# Patient Record
Sex: Female | Born: 1970
Health system: Southern US, Community
[De-identification: ages and names within clinical notes are randomized; demographics above are authoritative.]

## PROBLEM LIST (undated history)

## (undated) HISTORY — PX: ABDOMINAL HYSTERECTOMY: SHX81

---

## 2016-08-24 ENCOUNTER — Emergency Department (HOSPITAL_BASED_OUTPATIENT_CLINIC_OR_DEPARTMENT_OTHER): Payer: 59

## 2016-08-24 ENCOUNTER — Encounter (HOSPITAL_BASED_OUTPATIENT_CLINIC_OR_DEPARTMENT_OTHER): Payer: Self-pay | Admitting: *Deleted

## 2016-08-24 ENCOUNTER — Emergency Department (HOSPITAL_BASED_OUTPATIENT_CLINIC_OR_DEPARTMENT_OTHER)
Admission: EM | Admit: 2016-08-24 | Discharge: 2016-08-24 | Disposition: A | Discharge status: Home / Self Care | Payer: 59 | Attending: Emergency Medicine | Admitting: Emergency Medicine

## 2016-08-24 DIAGNOSIS — S300XXA Contusion of lower back and pelvis, initial encounter: Secondary | ICD-10-CM

## 2016-08-24 DIAGNOSIS — S5002XA Contusion of left elbow, initial encounter: Secondary | ICD-10-CM | POA: Diagnosis not present

## 2016-08-24 DIAGNOSIS — Y929 Unspecified place or not applicable: Secondary | ICD-10-CM | POA: Diagnosis not present

## 2016-08-24 DIAGNOSIS — W109XXA Fall (on) (from) unspecified stairs and steps, initial encounter: Secondary | ICD-10-CM | POA: Insufficient documentation

## 2016-08-24 DIAGNOSIS — Y999 Unspecified external cause status: Secondary | ICD-10-CM | POA: Insufficient documentation

## 2016-08-24 DIAGNOSIS — S59902A Unspecified injury of left elbow, initial encounter: Secondary | ICD-10-CM | POA: Diagnosis present

## 2016-08-24 DIAGNOSIS — Y9301 Activity, walking, marching and hiking: Secondary | ICD-10-CM | POA: Diagnosis not present

## 2016-08-24 DIAGNOSIS — W19XXXA Unspecified fall, initial encounter: Secondary | ICD-10-CM

## 2016-08-24 NOTE — ED Provider Notes (Signed)
[facility] Provider Note   CSN: [id number] Arrival date & time: 08/24/16  5621  By signing my name below, I, Donna Thompson, attest that this documentation has been prepared under the direction and in the presence of Tilden Fossa, MD. Electronically Signed: Thelma Thompson, Scribe. 08/24/16. 9:27 PM.  History   Chief Complaint Chief Complaint  Patient presents with  . Fall  The history is provided by the patient. No language interpreter was used.    HPI Comments: Donna Thompson is a 46 y.o. female who presents to the Emergency Department complaining of constant left elbow pain s/p a fall that occurred 6 hours ago. She also has associated knot on her buttocks. She states she was walking down the stairs when she accidentally slipped and fell on her bottom on each step. She has used ice with no relief. She denies head injury or syncope. Pt walked normally after the accident. Pt is left-hand dominant. Pt has no other medical problems and NKDA other than penicillins.   History reviewed. No pertinent past medical history.  There are no active problems to display for this patient.   Past Surgical History:  Procedure Laterality Date  . ABDOMINAL HYSTERECTOMY      OB History    No data available       Home Medications    Prior to Admission medications   Not on File    Family History No family history on file.  Social History Social History  Substance Use Topics  . Smoking status: Never Smoker  . Smokeless tobacco: Never Used  . Alcohol use No     Allergies   Penicillins   Review of Systems Review of Systems  Musculoskeletal: Positive for arthralgias.  Skin: Positive for wound.  Neurological: Negative for syncope and headaches.  All other systems reviewed and are negative.    Physical Exam Updated Vital Signs BP (!) 145/85 (BP Location: Right Arm)   Pulse 96   Temp 98.8 F (37.1 C) (Oral)   Resp 20   Ht 5\' 5"  (1.651 m)   Wt 67.1 kg (148 lb)    SpO2 100%   BMI 24.63 kg/m   Physical Exam  Constitutional: She is oriented to person, place, and time. She appears well-developed and well-nourished.  HENT:  Head: Normocephalic and atraumatic.  Cardiovascular: Normal rate and regular rhythm.   No murmur heard. Pulmonary/Chest: Effort normal and breath sounds normal. No respiratory distress.  Abdominal: Soft. There is no tenderness. There is no rebound and no guarding.  Musculoskeletal: She exhibits tenderness. She exhibits no edema.  Elbow mild tenderness over the olecranon  No swelling, normal ROM 2+ radial pulses bilaterally Moderate swelling to left buttock Moderated TTP over lower sacrum  Neurological: She is alert and oriented to person, place, and time.  5/5 strength in all 4 extremities   Skin: Skin is warm and dry.  Psychiatric: She has a normal mood and affect. Her behavior is normal.  Nursing note and vitals reviewed.    ED Treatments / Results  DIAGNOSTIC STUDIES: Oxygen Saturation is 100% on RA, normal by my interpretation.    COORDINATION OF CARE: 8:38 PM Discussed treatment plan with pt at bedside and pt agreed to plan. Labs (all labs ordered are listed, but only abnormal results are displayed) Labs Reviewed - No data to display  EKG  EKG Interpretation None       Radiology Dg Sacrum/coccyx  Result Date: 08/24/2016 CLINICAL DATA:  Fall down stairs.  Sacral  pain. EXAM: SACRUM AND COCCYX - 2+ VIEW COMPARISON:  None. FINDINGS: There is no evidence of fracture or other focal bone lesions. IMPRESSION: Negative. Electronically Signed   By: Charlett NoseKevin  Dover M.D.   On: 08/24/2016 20:56   Dg Elbow Complete Left  Result Date: 08/24/2016 CLINICAL DATA:  Pain after fall EXAM: LEFT ELBOW - COMPLETE 3+ VIEW COMPARISON:  None. FINDINGS: There is no evidence of fracture, dislocation, or joint effusion. There is no evidence of arthropathy or other focal bone abnormality. Soft tissues are unremarkable. IMPRESSION:  Negative. Electronically Signed   By: Gerome Samavid  Williams III M.D   On: 08/24/2016 20:18    Procedures Procedures (including critical care time)  Medications Ordered in ED Medications - No data to display   Initial Impression / Assessment and Plan / ED Course  I have reviewed the triage vital signs and the nursing notes.  Pertinent labs & imaging results that were available during my care of the patient were reviewed by me and considered in my medical decision making (see chart for details).     Patient here for evaluation of injuries following a fall. She has a hematoma to the left gluteal region with no evidence of major hemorrhage or compartment syndrome. She is neurovascularly intact on examination. No evidence of pelvic or hip fracture. Discussed the patient home care following a fall with contusions with range of motion exercises for the elbow as well as ice packs for the gluteal hematoma. Discussed Tylenol or ibuprofen for pain. Patient follow up and return precautions discussed.  Final Clinical Impressions(s) / ED Diagnoses   Final diagnoses:  Fall, initial encounter  Contusion of left elbow, initial encounter  Contusion of buttock, initial encounter    New Prescriptions New Prescriptions   No medications on file  I personally performed the services described in this documentation, which was scribed in my presence. The recorded information has been reviewed and is accurate.     Tilden Fossaees, Lenore Moyano, MD 08/24/16 2129

## 2016-08-24 NOTE — ED Triage Notes (Signed)
Pt says she slipped down approximately 4 carpeted steps a couple of hours ago. She c/o a knot on the left buttocks and pain in the left elbow. No meds prior to arrival.

## 2017-02-28 DIAGNOSIS — Z9181 History of falling: Secondary | ICD-10-CM | POA: Diagnosis not present

## 2017-02-28 DIAGNOSIS — B20 Human immunodeficiency virus [HIV] disease: Secondary | ICD-10-CM | POA: Diagnosis not present

## 2017-02-28 DIAGNOSIS — Z21 Asymptomatic human immunodeficiency virus [HIV] infection status: Secondary | ICD-10-CM | POA: Diagnosis not present

## 2017-02-28 DIAGNOSIS — Z79899 Other long term (current) drug therapy: Secondary | ICD-10-CM | POA: Diagnosis not present

## 2017-02-28 DIAGNOSIS — Z111 Encounter for screening for respiratory tuberculosis: Secondary | ICD-10-CM | POA: Diagnosis not present

## 2017-02-28 DIAGNOSIS — Z113 Encounter for screening for infections with a predominantly sexual mode of transmission: Secondary | ICD-10-CM | POA: Diagnosis not present

## 2017-03-06 DIAGNOSIS — Z1231 Encounter for screening mammogram for malignant neoplasm of breast: Secondary | ICD-10-CM | POA: Diagnosis not present

## 2017-08-07 MED FILL — ATRIPLA 600-200-300 MG TABS: 600-200-300 | 30 days supply | Qty: 30 | Fill #0

## 2017-08-07 MED FILL — SHIPPING COST: 1 days supply | Qty: 1 | Fill #0

## 2017-08-15 DIAGNOSIS — Z21 Asymptomatic human immunodeficiency virus [HIV] infection status: Secondary | ICD-10-CM | POA: Diagnosis not present

## 2017-09-04 ENCOUNTER — Encounter: Payer: Self-pay | Admitting: Pharmacist

## 2017-09-04 ENCOUNTER — Ambulatory Visit (INDEPENDENT_AMBULATORY_CARE_PROVIDER_SITE_OTHER): Payer: 59 | Admitting: Pharmacist

## 2017-09-04 DIAGNOSIS — Z79899 Other long term (current) drug therapy: Secondary | ICD-10-CM

## 2017-09-04 MED ORDER — EFAVIRENZ-EMTRICITAB-TENOFOVIR 600-200-300 MG PO TABS: 1.0000 | ORAL_TABLET | Freq: Every day | ORAL | 5 refills | Status: AC

## 2017-09-04 MED FILL — ATRIPLA 600-200-300 MG TABS: 600-200-300 | 30 days supply | Qty: 30 | Fill #0

## 2017-09-04 NOTE — Progress Notes (Signed)
   S: [facility] specialty medication therapy.    Patient is currently taking Atripla for HIV. Patient is managed by Dr. Jenelle MagesHairston for this. She has been on Atripla for about 5 years.   Adherence: denies any missed doses  Efficacy: HIV RNA is undetectable and she denies any adverse effects. Her physician discussed switching to a newer HIV medication but she declined because she felt like this was working well for her  Dosing:  HIV-1 infection: Oral: One tablet once daily. Dosage adjustment for concomitant rifampin (only if patient weighs ?50 kg): Administer additional efavirenz 200 mg once daily.  Dosing: Renal Impairment  CrCl ? 50 mL/minute: No dosage adjustment necessary. CrCl <50 mL/minute: Not recommended.  Dosing: Hepatic Impairment  Mild hepatic impairment (Child-Pugh class A): No dosage adjustment necessary; use with caution. Moderate or severe hepatic impairment (Child-Pugh class B, C): Not recommended.  Drug-drug interactions: none  Monitoring: HIV RNA: undetectable CD4 count: 380 S/sx of opportunistic infections: denies Neuropsychiatric s/sx: denies Skin reactions: denies Hepatotoxicity: denies Nephrotoxicity: denies S/sx of lactic acidosis: denies S/sx of fractures: denies Fat redistribution: denies  O:    Creatinine and LFTs WNL per Care Everywhere  No results found for: WBC, HGB, HCT, MCV, PLT    Chemistry   No results found for: NA, K, CL, CO2, BUN, CREATININE, GLU No results found for: CALCIUM, ALKPHOS, AST, ALT, BILITOT    No results found for: CD4TCELL, CD4TABS  No results found for: HIV1RNAQUANT  HIV RNA undetetable  A/P: 1. Medication review: patient is on Atripla for HIV. Reviewed the medication with the patient, including the following: Atripla is a combination medication used for the treatment of HIV. Patient educated on purpose, proper use and potential adverse effects of Atripla. Common  adverse effects including metabolic disturbances, neuropsychiatric symptoms, and skin reactions. Adherence is crucial when using this drug to avoid mutations and resistance. No recommendations for changes.    Alvino BloodStacey Karl Hammer, PharmD, BCPS, BCACP, CPP Clinical Pharmacist Practitioner  [phone]

## 2017-10-06 MED FILL — SHIPPING COST: 1 days supply | Qty: 1 | Fill #1

## 2017-10-06 MED FILL — ATRIPLA 600-200-300 MG TABS: 600-200-300 | 30 days supply | Qty: 30 | Fill #1 | Status: TO

## 2018-06-17 IMAGING — DX DG ELBOW COMPLETE 3+V*L*
4 series · 4 of 4 positions shown · non-contrast
Comparison: None.

CLINICAL DATA: Pain after fall

EXAM:
LEFT ELBOW - COMPLETE 3+ VIEW

[elbow ap]
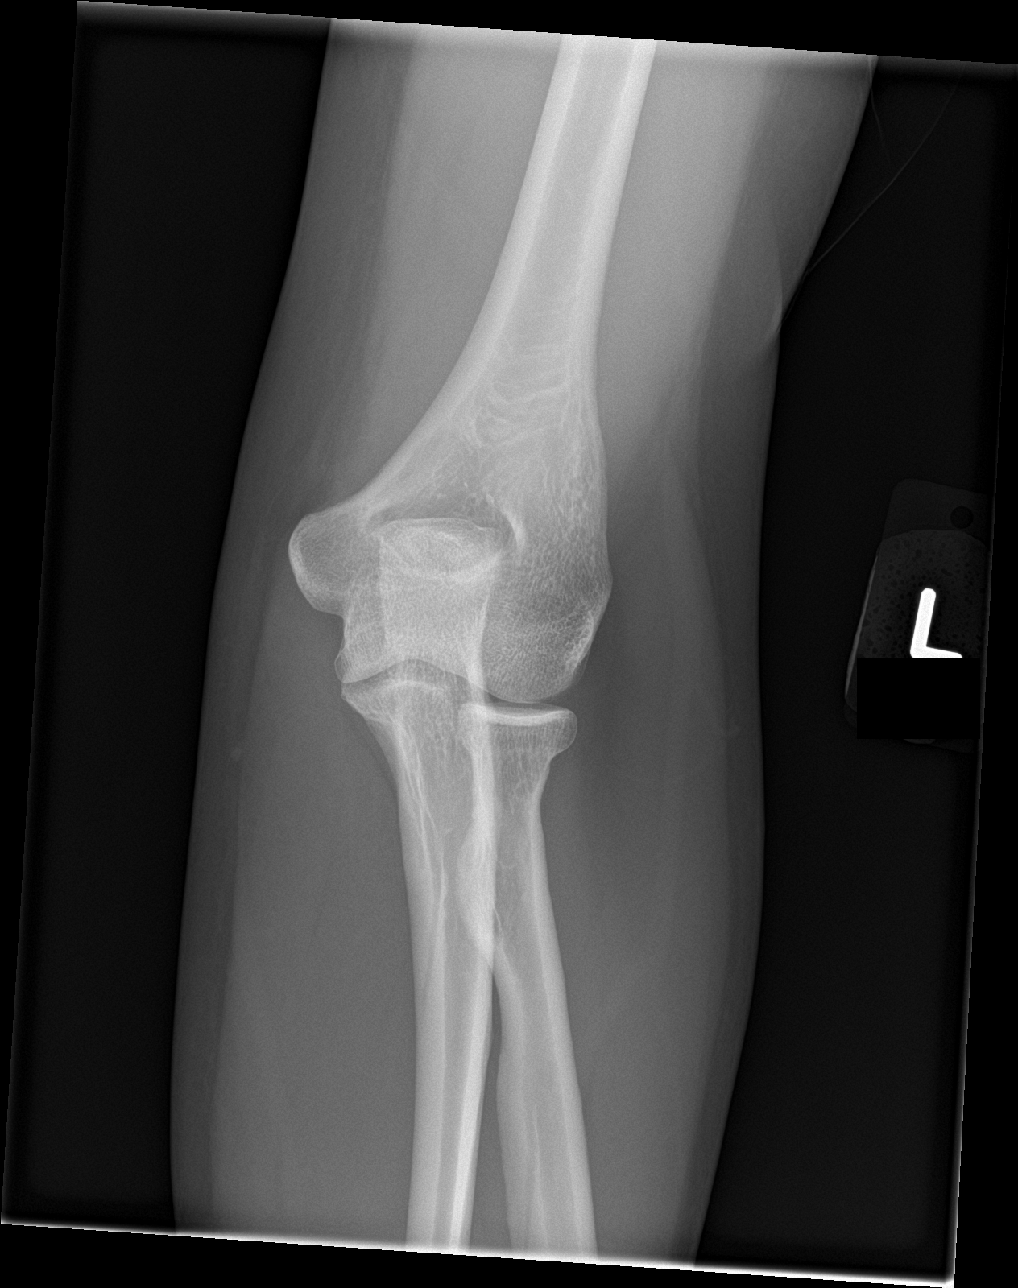

[elbow obl (1 of 2)]
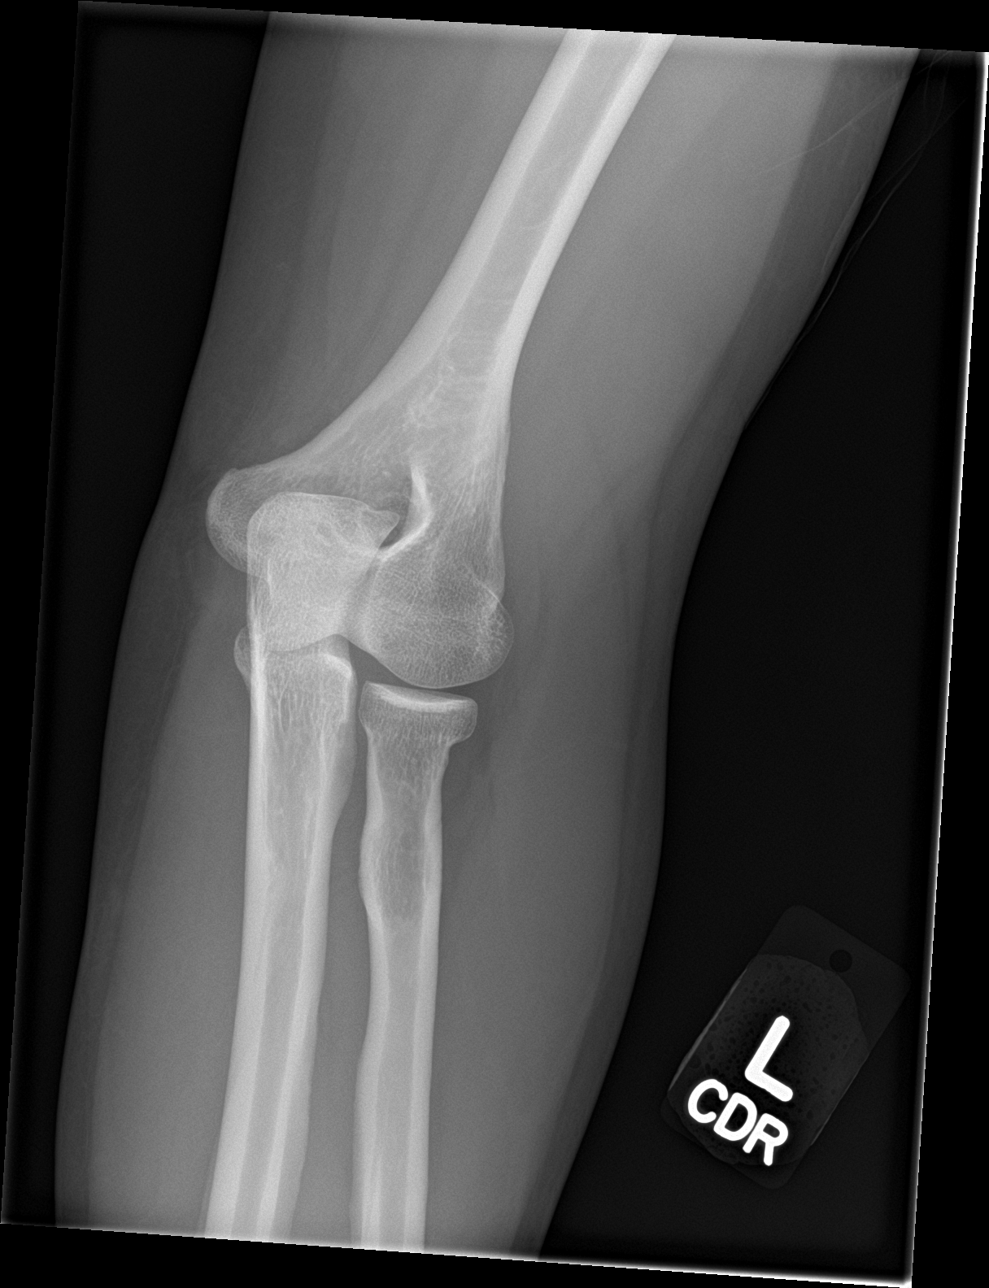

[elbow obl (2 of 2)]
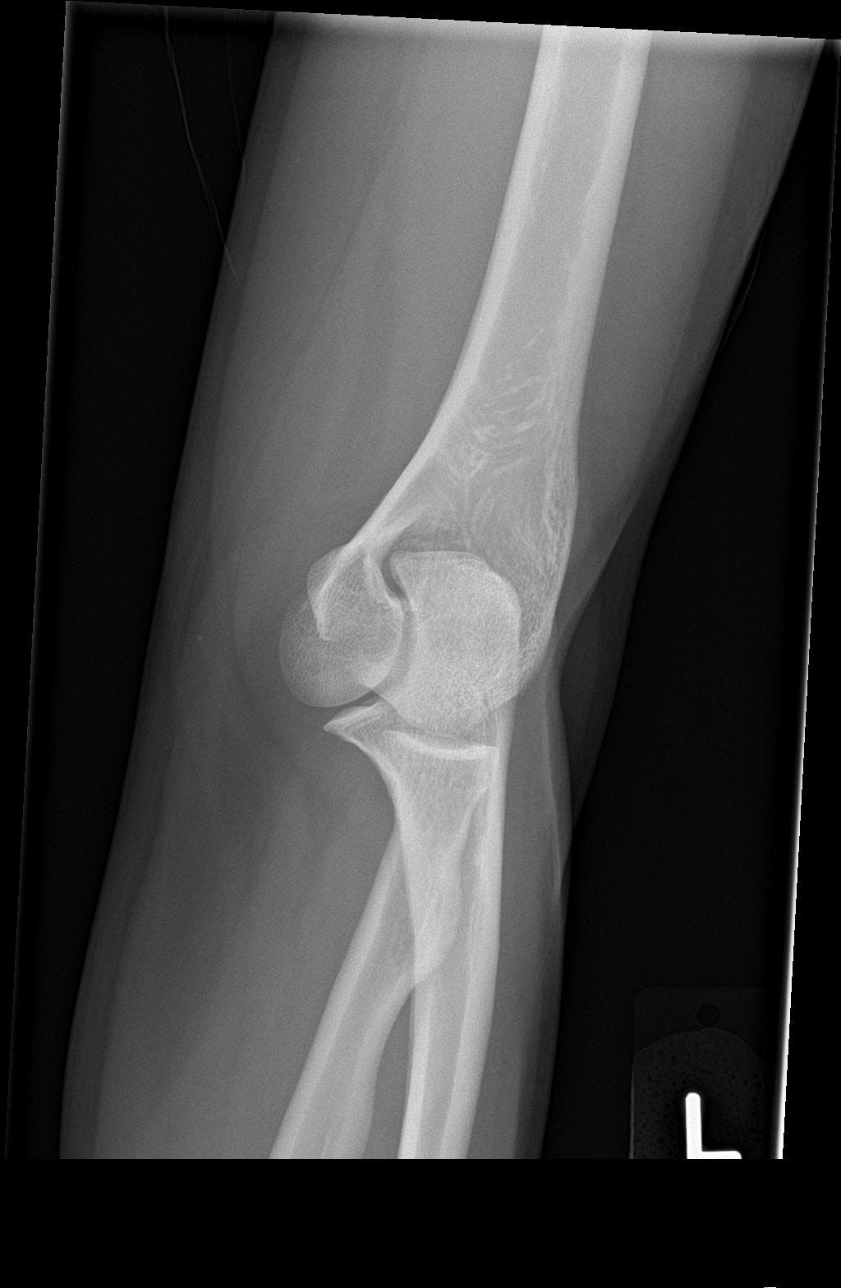

[elbow lat]
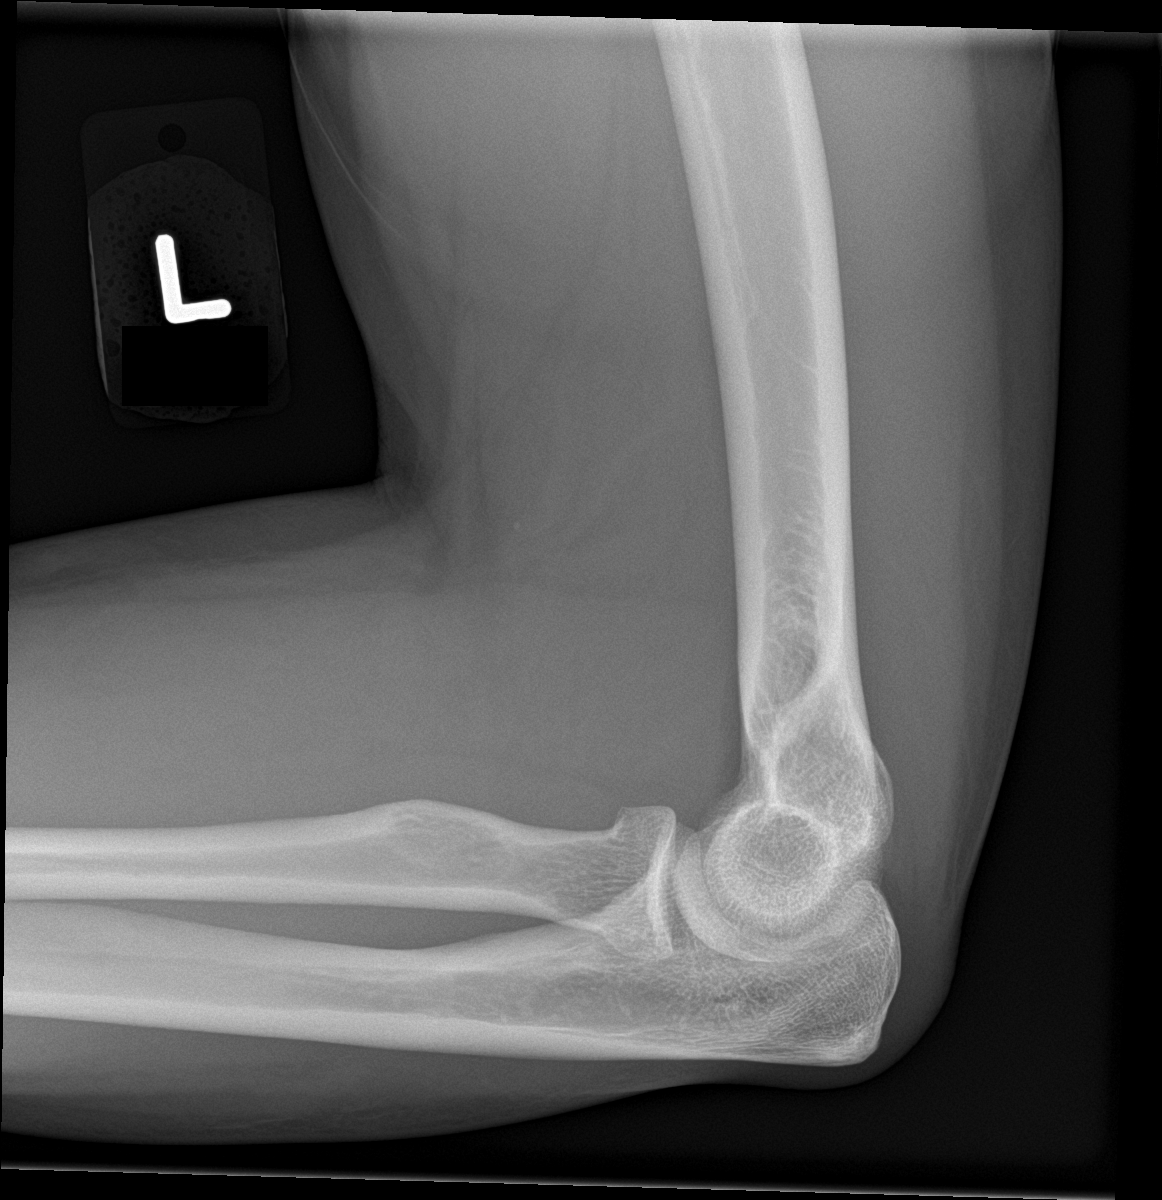

[4 of 4 positions shown; findings below may reference images not displayed]

FINDINGS: There is no evidence of fracture, dislocation, or joint effusion.
There is no evidence of arthropathy or other focal bone abnormality.
Soft tissues are unremarkable.
IMPRESSION: Negative.

## 2022-11-15 ENCOUNTER — Inpatient Hospital Stay (HOSPITAL_COMMUNITY): Payer: Managed Care, Other (non HMO)

## 2022-11-15 ENCOUNTER — Inpatient Hospital Stay (HOSPITAL_COMMUNITY)
Admission: EM | Admit: 2022-11-15 | Discharge: 2022-11-18 | DRG: 872 | Disposition: A | Discharge status: Home / Self Care | Payer: Managed Care, Other (non HMO) | Attending: Internal Medicine | Admitting: Internal Medicine

## 2022-11-15 ENCOUNTER — Emergency Department (HOSPITAL_COMMUNITY): Payer: Managed Care, Other (non HMO)

## 2022-11-15 ENCOUNTER — Encounter (HOSPITAL_COMMUNITY): Payer: Self-pay

## 2022-11-15 ENCOUNTER — Other Ambulatory Visit: Payer: Self-pay

## 2022-11-15 DIAGNOSIS — Z21 Asymptomatic human immunodeficiency virus [HIV] infection status: Secondary | ICD-10-CM | POA: Diagnosis present

## 2022-11-15 DIAGNOSIS — L02413 Cutaneous abscess of right upper limb: Secondary | ICD-10-CM | POA: Diagnosis present

## 2022-11-15 DIAGNOSIS — E538 Deficiency of other specified B group vitamins: Secondary | ICD-10-CM | POA: Diagnosis present

## 2022-11-15 DIAGNOSIS — D509 Iron deficiency anemia, unspecified: Secondary | ICD-10-CM | POA: Diagnosis present

## 2022-11-15 DIAGNOSIS — Z79899 Other long term (current) drug therapy: Secondary | ICD-10-CM

## 2022-11-15 DIAGNOSIS — M7989 Other specified soft tissue disorders: Secondary | ICD-10-CM

## 2022-11-15 DIAGNOSIS — R030 Elevated blood-pressure reading, without diagnosis of hypertension: Secondary | ICD-10-CM | POA: Diagnosis present

## 2022-11-15 DIAGNOSIS — M79604 Pain in right leg: Secondary | ICD-10-CM | POA: Diagnosis present

## 2022-11-15 DIAGNOSIS — M79674 Pain in right toe(s): Secondary | ICD-10-CM | POA: Diagnosis not present

## 2022-11-15 DIAGNOSIS — E876 Hypokalemia: Secondary | ICD-10-CM | POA: Diagnosis present

## 2022-11-15 DIAGNOSIS — B20 Human immunodeficiency virus [HIV] disease: Secondary | ICD-10-CM | POA: Diagnosis not present

## 2022-11-15 DIAGNOSIS — W57XXXA Bitten or stung by nonvenomous insect and other nonvenomous arthropods, initial encounter: Secondary | ICD-10-CM | POA: Diagnosis present

## 2022-11-15 DIAGNOSIS — Z90710 Acquired absence of both cervix and uterus: Secondary | ICD-10-CM

## 2022-11-15 DIAGNOSIS — L0390 Cellulitis, unspecified: Secondary | ICD-10-CM | POA: Diagnosis present

## 2022-11-15 DIAGNOSIS — Z880 Allergy status to penicillin: Secondary | ICD-10-CM

## 2022-11-15 DIAGNOSIS — A419 Sepsis, unspecified organism: Principal | ICD-10-CM

## 2022-11-15 DIAGNOSIS — L03115 Cellulitis of right lower limb: Secondary | ICD-10-CM | POA: Diagnosis present

## 2022-11-15 LAB — BASIC METABOLIC PANEL
Anion gap: 8 (ref 5–15)
BUN: 9 mg/dL (ref 6–20)
CO2: 23 mmol/L (ref 22–32)
Calcium: 8.5 mg/dL — ABNORMAL LOW (ref 8.9–10.3)
Chloride: 107 mmol/L (ref 98–111)
Creatinine, Ser: 0.6 mg/dL (ref 0.44–1.00)
GFR, Estimated: >60 mL/min (ref >60)
Glucose, Bld: 110 mg/dL — ABNORMAL HIGH (ref 70–99)
Potassium: 3.2 mmol/L — ABNORMAL LOW (ref 3.5–5.1)
Sodium: 138 mmol/L (ref 135–145)

## 2022-11-15 LAB — CBC WITH DIFFERENTIAL/PLATELET
Abs Immature Granulocytes: 0.09 10*3/uL — ABNORMAL HIGH (ref 0.00–0.07)
Basophils Absolute: 0 10*3/uL (ref 0.0–0.1)
Basophils Relative: 0 %
Eosinophils Absolute: 0.1 10*3/uL (ref 0.0–0.5)
Eosinophils Relative: 1 %
HCT: 26.4 % — ABNORMAL LOW (ref 36.0–46.0)
Hemoglobin: 8.4 g/dL — ABNORMAL LOW (ref 12.0–15.0)
Immature Granulocytes: 1 %
Lymphocytes Relative: 12 %
Lymphs Abs: 1.4 10*3/uL (ref 0.7–4.0)
MCH: 25.9 pg — ABNORMAL LOW (ref 26.0–34.0)
MCHC: 31.8 g/dL (ref 30.0–36.0)
MCV: 81.5 fL (ref 80.0–100.0)
Monocytes Absolute: 0.6 10*3/uL (ref 0.1–1.0)
Monocytes Relative: 5 %
Neutro Abs: 9.7 10*3/uL — ABNORMAL HIGH (ref 1.7–7.7)
Neutrophils Relative %: 81 %
Platelets: 268 10*3/uL (ref 150–400)
RBC: 3.24 MIL/uL — ABNORMAL LOW (ref 3.87–5.11)
RDW: 18.2 % — ABNORMAL HIGH (ref 11.5–15.5)
WBC: 11.9 10*3/uL — ABNORMAL HIGH (ref 4.0–10.5)
nRBC: 0 % (ref 0.0–0.2)

## 2022-11-15 MED ORDER — MORPHINE SULFATE (PF) 2 MG/ML IV SOLN: 1.0000 mg | INTRAVENOUS | Status: DC | PRN

## 2022-11-15 MED ORDER — NAPROXEN 250 MG PO TABS: 500.0000 mg | ORAL_TABLET | ORAL | Status: DC | PRN

## 2022-11-15 MED ORDER — METRONIDAZOLE 500 MG/100ML IV SOLN
500.0000 mg | Freq: Two times a day (BID) | INTRAVENOUS | Status: DC
  Administered 2022-11-15 – 2022-11-18 (×5): 500 mg via INTRAVENOUS
  Filled 2022-11-15 (×6): qty 100

## 2022-11-15 MED ORDER — ONDANSETRON HCL 4 MG/2ML IJ SOLN
4.0000 mg | Freq: Once | INTRAMUSCULAR | Status: AC
  Administered 2022-11-15: 4 mg via INTRAVENOUS
  Filled 2022-11-15: qty 2

## 2022-11-15 MED ORDER — HYDROMORPHONE HCL 1 MG/ML IJ SOLN
0.5000 mg | INTRAMUSCULAR | Status: DC | PRN
  Administered 2022-11-17: 0.5 mg via INTRAVENOUS
  Filled 2022-11-15: qty 0.5

## 2022-11-15 MED ORDER — TRAMADOL HCL 50 MG PO TABS
50.0000 mg | ORAL_TABLET | Freq: Four times a day (QID) | ORAL | Status: DC | PRN
  Administered 2022-11-17: 50 mg via ORAL
  Filled 2022-11-15: qty 1

## 2022-11-15 MED ORDER — VANCOMYCIN HCL 750 MG/150ML IV SOLN
750.0000 mg | Freq: Two times a day (BID) | INTRAVENOUS | Status: DC
  Administered 2022-11-16 – 2022-11-17 (×4): 750 mg via INTRAVENOUS
  Filled 2022-11-15 (×5): qty 150

## 2022-11-15 MED ORDER — MORPHINE SULFATE (PF) 4 MG/ML IV SOLN
4.0000 mg | Freq: Once | INTRAVENOUS | Status: AC
  Administered 2022-11-15: 4 mg via INTRAVENOUS
  Filled 2022-11-15: qty 1

## 2022-11-15 MED ORDER — MONTELUKAST SODIUM 10 MG PO TABS
10.0000 mg | ORAL_TABLET | Freq: Every day | ORAL | Status: DC
  Administered 2022-11-16 – 2022-11-18 (×3): 10 mg via ORAL
  Filled 2022-11-15 (×3): qty 1

## 2022-11-15 MED ORDER — ACETAMINOPHEN 325 MG PO TABS: 650.0000 mg | ORAL_TABLET | Freq: Four times a day (QID) | ORAL | Status: DC | PRN

## 2022-11-15 MED ORDER — SODIUM CHLORIDE 0.9 % IV BOLUS
1000.0000 mL | Freq: Once | INTRAVENOUS | Status: AC
  Administered 2022-11-15: 1000 mL via INTRAVENOUS

## 2022-11-15 MED ORDER — FERROUS SULFATE 325 (65 FE) MG PO TABS
325.0000 mg | ORAL_TABLET | Freq: Every day | ORAL | Status: DC
  Administered 2022-11-16 – 2022-11-18 (×3): 325 mg via ORAL
  Filled 2022-11-15 (×3): qty 1

## 2022-11-15 MED ORDER — EFAVIRENZ-EMTRICITAB-TENOFOVIR 600-200-300 MG PO TABS
1.0000 | ORAL_TABLET | Freq: Every day | ORAL | Status: DC
  Administered 2022-11-16 – 2022-11-17 (×2): 1 via ORAL
  Filled 2022-11-15 (×7): qty 1

## 2022-11-15 MED ORDER — ONDANSETRON HCL 4 MG/2ML IJ SOLN: 4.0000 mg | Freq: Four times a day (QID) | INTRAMUSCULAR | Status: DC | PRN

## 2022-11-15 MED ORDER — VANCOMYCIN HCL IN DEXTROSE 1-5 GM/200ML-% IV SOLN
1000.0000 mg | Freq: Once | INTRAVENOUS | Status: DC
  Filled 2022-11-15: qty 200

## 2022-11-15 MED ORDER — POTASSIUM CHLORIDE CRYS ER 20 MEQ PO TBCR
40.0000 meq | EXTENDED_RELEASE_TABLET | Freq: Once | ORAL | Status: AC
  Administered 2022-11-15: 40 meq via ORAL
  Filled 2022-11-15: qty 2

## 2022-11-15 MED ORDER — NAPROXEN 250 MG PO TABS
500.0000 mg | ORAL_TABLET | Freq: Two times a day (BID) | ORAL | Status: DC | PRN
  Administered 2022-11-15 – 2022-11-16 (×2): 500 mg via ORAL
  Filled 2022-11-15 (×2): qty 2

## 2022-11-15 MED ORDER — KETOROLAC TROMETHAMINE 15 MG/ML IJ SOLN
15.0000 mg | Freq: Once | INTRAMUSCULAR | Status: AC
  Administered 2022-11-15: 15 mg via INTRAVENOUS
  Filled 2022-11-15: qty 1

## 2022-11-15 MED ORDER — SODIUM CHLORIDE 0.9 % IV SOLN
2.0000 g | Freq: Three times a day (TID) | INTRAVENOUS | Status: DC
  Administered 2022-11-16 – 2022-11-18 (×6): 2 g via INTRAVENOUS
  Filled 2022-11-15 (×7): qty 12.5

## 2022-11-15 MED ORDER — SODIUM CHLORIDE 0.9 % IV SOLN
2.0000 g | Freq: Once | INTRAVENOUS | Status: AC
  Administered 2022-11-16: 2 g via INTRAVENOUS
  Filled 2022-11-15: qty 12.5

## 2022-11-15 NOTE — Assessment & Plan Note (Signed)
-  right foot cellulitis  -On exam, patient had notable blackened discoloration on the dorsal surface of right toes with a potential healed ulcer to the medial side of the right great toe.  However does not appear necrotic and no crepitus palpated.  Pain was not out of proportion with exam.  I performed a bedside Doppler and she had good dorsalis pedis and posterior tibialis pulse. Will still pursue ABI study to further evaluate -will broaden antibiotics with IV vancomycin, cefepime and Flagyl -blood cultures pending

## 2022-11-15 NOTE — ED Triage Notes (Signed)
Patient reports she got bit on her arm but now has discoloration and swelling with red streaking to right leg and foot.

## 2022-11-15 NOTE — Assessment & Plan Note (Signed)
-  last Hgb in 1/24 of 9. Now 8.4 on presentation -continue iron supplementation

## 2022-11-15 NOTE — ED Notes (Signed)
+  pedal pulse upon palpation but discoloration and possible pustule at base of great toe.

## 2022-11-15 NOTE — Plan of Care (Signed)
Problem: Clinical Measurements: Goal: Respiratory complications will improve Outcome: Progressing Goal: Cardiovascular complication will be avoided Outcome: Progressing   Problem: Activity: Goal: Risk for activity intolerance will decrease Outcome: Progressing   Problem: Elimination: Goal: Will not experience complications related to bowel motility Outcome: Progressing Goal: Will not experience complications related to urinary retention Outcome: Progressing   Problem: Pain Managment: Goal: General experience of comfort will improve Outcome: Progressing   Problem: Safety: Goal: Ability to remain free from injury will improve Outcome: Progressing

## 2022-11-15 NOTE — H&P (Signed)
History and Physical    Patient: Donna Thompson XBJ:[id number] DOB: 1971-01-13 DOA: 11/15/2022 DOS: the patient was seen and examined on 11/15/2022 PCP: System, Provider Not In  Patient coming from: Home  Chief Complaint:  Chief Complaint  Patient presents with   Cellulitis   HPI: [name] is a 52 y.o. female with medical history significant of HIV on antiviral, iron deficiency anemia who presents with discoloration of her right foot.   About 5 days ago noticed an insect bite to right anterior forearm that swelled up and then by the afternoon noticed swelling and discoloration of right great toe. Discoloration has since spread across her toes and foot. Entire foot is edematous. Has felt increase pain. No paresthesia. No fever or chills.   In the ED, she was afebrile with HR of 113, mildly elevated blood pressure of 141/81 on room air.  CBC with leukocytosis of 11.9, hemoglobin 8.4.  BMP with mild hypokalemia of 3.2 and otherwise normal electrolytes.  Venous Doppler ultrasound of bilateral lower extremity was negative for DVT.  Hospitalist then consulted for admission  On exam, patient had notable blackened discoloration on the dorsal surface of right toes with a potential healed ulcer to the medial side of the right great toe.  However does not appear necrotic.  No crepitus palpated.  Pain was not out of proportion with exam.  I performed a bedside Doppler and she had good dorsalis pedis and posterior tibialis pulse.  Review of Systems: As mentioned in the history of present illness. All other systems reviewed and are negative. History reviewed. No pertinent past medical history. Past Surgical History:  Procedure Laterality Date   ABDOMINAL HYSTERECTOMY     Social History:  reports that she has never smoked. She has never used smokeless tobacco. She reports that she does not drink alcohol and does not use drugs.  Allergies  Allergen Reactions   Penicillins Itching     History reviewed. No pertinent family history.  Prior to Admission medications   Medication Sig Start Date End Date Taking? Authorizing Provider  efavirenz-emtricitabine-tenofovir (ATRIPLA) 600-200-300 MG tablet Take 1 tablet by mouth daily. Take 1 tablet by mouth once daily. 09/04/17   Quentin Angst, MD  iron polysaccharides (NIFEREX) 150 MG capsule Take by mouth.    [provider]    Physical Exam: Vitals:   11/15/22 1320 11/15/22 1332 11/15/22 1845 11/15/22 2018  BP: (!) 156/96  (!) 141/81 (!) 144/78  Pulse: (!) 113  92 95  Resp: 16  18 16   Temp: 98.8 F (37.1 C)  98.8 F (37.1 C) 98.2 F (36.8 C)  TempSrc:   Oral Oral  SpO2: 98%  100% 100%  Weight:  65.8 kg    Height:  5\' 5"  (1.651 m)     Constitutional: NAD, calm, comfortable, nontoxic well-appearing middle-age female sitting up on the edge of the hallway bed Eyes: lids and conjunctivae normal ENMT: Mucous membranes are moist. Neck: normal, supple Respiratory: clear to auscultation bilaterally, no wheezing, no crackles. Normal respiratory effort. No accessory muscle use.  Cardiovascular: Regular rate and rhythm, no murmurs / rubs / gallops. No extremity edema. 2+ pedal pulses. 2+right posterior tibialis pulse.  No carotid bruits.  Abdomen: Soft nontender.   Musculoskeletal: no clubbing / cyanosis. No joint deformity upper and lower extremities. Good ROM, no contractures. Normal muscle tone.  Skin: Blackened discoloration of the dorsal surface of the right anterior foot with generalized edema of the foot.  There is a healed  small ulceration to the proximal medial side of the right great toe.  Patient with mild to moderate pain on deep palpation but proportion to exam.  She notes a slight paresthesia to the dorsal surface of the right great toe compared to the left.  However sensation to digits 2-5 on the right toes were the same as the left. No crepitus of the right foot. Right foot and extremity was warm to the  touch.   Right UE: Raised papular lesion on erythematous indurated base to right mid anterior forearm. No drainage or obvious fluctuance.   Neurologic: CN 2-12 grossly intact. Sensation intact, Strength 5/5 in all 4.  Psychiatric: Normal judgment and insight. Alert and oriented x 3. Normal mood.   Data Reviewed:  See HPI  Assessment and Plan: * Sepsis (HCC) -right foot cellulitis  -On exam, patient had notable blackened discoloration on the dorsal surface of right toes with a potential healed ulcer to the medial side of the right great toe.  However does not appear necrotic and no crepitus palpated.  Pain was not out of proportion with exam.  I performed a bedside Doppler and she had good dorsalis pedis and posterior tibialis pulse. Will still pursue ABI study to further evaluate -will broaden antibiotics with IV vancomycin, cefepime and Flagyl -blood cultures pending   Iron deficiency anemia -last Hgb in 1/24 of 9. Now 8.4 on presentation -continue iron supplementation  HIV (human immunodeficiency virus infection) (HCC) -follows infectious disease with [facility] -last viral load in January was undetectable  -continue antiviral  Abscess of right arm -indurated papular lesions on right anterior forearm. Will ultrasound to see if any drainable abscess. -on antibiotics as above      Advance Care Planning:   Code Status: Full Code   Consults: none  Family Communication: none at bedside  Severity of Illness: The appropriate patient status for this patient is INPATIENT. Inpatient status is judged to be reasonable and necessary in order to provide the required intensity of service to ensure the patient's safety. The patient's presenting symptoms, physical exam findings, and initial radiographic and laboratory data in the context of their chronic comorbidities is felt to place them at high risk for further clinical deterioration. Furthermore, it is not anticipated that the  patient will be medically stable for discharge from the hospital within 2 midnights of admission.   * I certify that at the point of admission it is my clinical judgment that the patient will require inpatient hospital care spanning beyond 2 midnights from the point of admission due to high intensity of service, high risk for further deterioration and high frequency of surveillance required.*  Author: Anselm Jungling, DO 11/15/2022 9:33 PM  For on call review [email or web address].

## 2022-11-15 NOTE — Progress Notes (Addendum)
Pharmacy Antibiotic Note  Donna Thompson is a 52 y.o. female for which pharmacy has been consulted for vancomycin and zosyn dosing for cellulitis.  Patient with a history of HIV. Patient presenting with discoloration of rt foot.  Cefepime x 1 given in ED SCr 0.6 WBC 11.9; T 98.2; HR 95; RR 16  Plan: Cefepime 2g q8hr  Vancomycin 1000 mg once then 750 mg q12hr (eAUC 470.9) unless change in renal function Monitor WBC, fever, renal function, cultures De-escalate when able Levels at steady state  Height: 5\' 5"  (165.1 cm) Weight: 65.8 kg (145 lb) IBW/kg (Calculated) : 57  Temp (24hrs), Avg:98.8 F (37.1 C), Min:98.8 F (37.1 C), Max:98.8 F (37.1 C)  Recent Labs  Lab 11/15/22 1334  WBC 11.9*  CREATININE 0.60    Estimated Creatinine Clearance: 75.7 mL/min (by C-G formula based on SCr of 0.6 mg/dL).    Allergies  Allergen Reactions   Penicillins Itching   Microbiology results: Pending  Thank you for allowing pharmacy to be a part of this patient's care.  Delmar Landau, PharmD, BCPS 11/15/2022 7:38 PM ED Clinical Pharmacist -  [phone]

## 2022-11-15 NOTE — Progress Notes (Signed)
Lower extremity venous duplex completed. Please see CV Procedures for preliminary results.  Shona Simpson, RVT 11/15/22 3:37 PM

## 2022-11-15 NOTE — Assessment & Plan Note (Signed)
-  indurated papular lesions on right anterior forearm. Will ultrasound to see if any drainable abscess. -on antibiotics as above

## 2022-11-15 NOTE — ED Provider Notes (Signed)
[place] EMERGENCY DEPARTMENT AT [facility] Provider Note   CSN: [id number] Arrival date & time: 11/15/22  1310    History  Chief Complaint  Patient presents with   Cellulitis    Donna Thompson is a 52 y.o. female history of HIV, well-controlled, [facility] for evaluation of pain and swelling to right lower extremity.  She is unsure if she got bit by any insect.  Pain started in right great toe, spread to her remaining metatarsals and now has redness and swelling going up the dorsum of her right foot, ankle and into her right lower extremity.  There is redness, tenderness when she palpates her foot.  No fever.  No history of blood clots.  No numbness or weakness.  She is able to move her foot and leg without difficulty.  Worse with walking.  HPI     Home Medications Prior to Admission medications   Medication Sig Start Date End Date Taking? Authorizing Provider  efavirenz-emtricitabine-tenofovir (ATRIPLA) 600-200-300 MG tablet Take 1 tablet by mouth daily. Take 1 tablet by mouth once daily. 09/04/17   Quentin Angst, MD  iron polysaccharides (NIFEREX) 150 MG capsule Take by mouth.    [provider]      Allergies    Penicillins    Review of Systems   Review of Systems  HENT: Negative.    Respiratory: Negative.    Cardiovascular: Negative.   Gastrointestinal: Negative.   Genitourinary: Negative.   Skin:  Positive for color change.  Neurological: Negative.   All other systems reviewed and are negative.   Physical Exam Updated Vital Signs BP (!) 141/81   Pulse 92   Temp 98.8 F (37.1 C) (Oral)   Resp 18   Ht 5\' 5"  (1.651 m)   Wt 65.8 kg   SpO2 100%   BMI 24.13 kg/m  Physical Exam Vitals and nursing note reviewed.  Constitutional:      General: She is not in acute distress.    Appearance: She is well-developed. She is not ill-appearing, toxic-appearing or diaphoretic.  HENT:     Head: Atraumatic.  Eyes:     Pupils:  Pupils are equal, round, and reactive to light.  Cardiovascular:     Rate and Rhythm: Normal rate.     Pulses: Normal pulses.          Radial pulses are 2+ on the right side and 2+ on the left side.       Dorsalis pedis pulses are 2+ on the right side and 2+ on the left side.       Posterior tibial pulses are 2+ on the right side and 2+ on the left side.     Heart sounds: Normal heart sounds.  Pulmonary:     Effort: Pulmonary effort is normal. No respiratory distress.     Breath sounds: Normal breath sounds.  Abdominal:     General: Bowel sounds are normal. There is no distension.     Palpations: Abdomen is soft.  Musculoskeletal:        General: Normal range of motion.     Cervical back: Normal range of motion.     Comments: Diffuse tenderness dorsum right foot into right lower extremity, stops mid anterior tibial region.  No crepitus.  Has some darkening of all her toes distally.  She has 2+ DP, PT pulses bilaterally.  She is able to plantarflex and dorsiflex at ankles and knees without difficulty.  Wiggles toes without  difficulty.  Skin:    General: Skin is warm and dry.     Capillary Refill: Capillary refill takes less than 2 seconds.     Comments: See picture in chart for BLE. 1 cm area of induration to right forearm. No erythema, warmth, crepitus.  Neurological:     General: No focal deficit present.     Mental Status: She is alert.     Cranial Nerves: Cranial nerves 2-12 are intact.     Sensory: Sensation is intact.     Motor: Motor function is intact.     Comments: Intact sensation  Psychiatric:        Mood and Affect: Mood normal.    ED Results / Procedures / Treatments   Labs (all labs ordered are listed, but only abnormal results are displayed) Labs Reviewed  CBC WITH DIFFERENTIAL/PLATELET - Abnormal; Notable for the following components:      Result Value   WBC 11.9 (*)    RBC 3.24 (*)    Hemoglobin 8.4 (*)    HCT 26.4 (*)    MCH 25.9 (*)    RDW 18.2 (*)     Neutro Abs 9.7 (*)    Abs Immature Granulocytes 0.09 (*)    All other components within normal limits  BASIC METABOLIC PANEL - Abnormal; Notable for the following components:   Potassium 3.2 (*)    Glucose, Bld 110 (*)    Calcium 8.5 (*)    All other components within normal limits  CULTURE, BLOOD (ROUTINE X 2)  CULTURE, BLOOD (ROUTINE X 2)  CBC  BASIC METABOLIC PANEL  I-STAT CG4 LACTIC ACID, ED  I-STAT CG4 LACTIC ACID, ED    EKG None  Radiology DG Foot Complete Right  Result Date: 11/15/2022 CLINICAL DATA:  Swelling EXAM: RIGHT FOOT COMPLETE - 3 VIEW COMPARISON:  None Available. FINDINGS: There is no evidence of fracture or dislocation. There is no evidence of arthropathy or other focal bone abnormality. Soft tissues are unremarkable. IMPRESSION: No acute osseous abnormality Electronically Signed   By: Karen Kays M.D.   On: 11/15/2022 17:55   VAS [place] LOWER EXTREMITY VENOUS (DVT) (ONLY [facility] & [facility])  Result Date: 11/15/2022  Lower Venous DVT Study Patient Name:  Donna Thompson  Date of Exam:   11/15/2022 Medical Rec #: [id number]        Accession #:    [id number] Date of Birth: July 26, 1970        Patient Gender: F Patient Age:   43 years Exam Location:  [facility] Procedure:      VAS [place] LOWER EXTREMITY VENOUS (DVT) Referring Phys: KEN LE --------------------------------------------------------------------------------  Indications: Pain.  Risk Factors: Past pregnancy. Comparison Study: No prior study Performing Technologist: Shona Simpson  Examination Guidelines: A complete evaluation includes B-mode imaging, spectral Doppler, color Doppler, and power Doppler as needed of all accessible portions of each vessel. Bilateral testing is considered an integral part of a complete examination. Limited examinations for reoccurring indications may be performed as noted. The reflux portion of the exam is performed with the patient in reverse Trendelenburg.   +---------+---------------+---------+-----------+----------+--------------+ RIGHT    CompressibilityPhasicitySpontaneityPropertiesThrombus Aging +---------+---------------+---------+-----------+----------+--------------+ CFV      Full           Yes      Yes                                 +---------+---------------+---------+-----------+----------+--------------+ SFJ  Full                                                        +---------+---------------+---------+-----------+----------+--------------+ FV Prox  Full                                                        +---------+---------------+---------+-----------+----------+--------------+ FV Mid   Full                                                        +---------+---------------+---------+-----------+----------+--------------+ FV DistalFull                                                        +---------+---------------+---------+-----------+----------+--------------+ PFV      Full                                                        +---------+---------------+---------+-----------+----------+--------------+ POP      Full           Yes      Yes                                 +---------+---------------+---------+-----------+----------+--------------+ PTV      Full                                                        +---------+---------------+---------+-----------+----------+--------------+ PERO     Full                                                        +---------+---------------+---------+-----------+----------+--------------+   +----+---------------+---------+-----------+----------+--------------+ LEFTCompressibilityPhasicitySpontaneityPropertiesThrombus Aging +----+---------------+---------+-----------+----------+--------------+ CFV Full           Yes      Yes                                  +----+---------------+---------+-----------+----------+--------------+     Summary: RIGHT: - There is no evidence of deep vein thrombosis in the lower extremity.  - No cystic structure found in the popliteal fossa. - Ultrasound characteristics of enlarged lymph nodes are noted in the groin.  LEFT: - No evidence of common femoral vein obstruction.   *See table(s) above for measurements and observations.  Preliminary     Procedures .Critical Care  Performed by: Linwood Dibbles, PA-C Authorized by: Linwood Dibbles, PA-C   Critical care provider statement:    Critical care time (minutes):  35   Critical care was necessary to treat or prevent imminent or life-threatening deterioration of the following conditions:  Sepsis   Critical care was time spent personally by me on the following activities:  Development of treatment plan with patient or surrogate, discussions with consultants, evaluation of patient's response to treatment, examination of patient, ordering and review of laboratory studies, ordering and review of radiographic studies, ordering and performing treatments and interventions, pulse oximetry, re-evaluation of patient's condition and review of old charts     Medications Ordered in ED Medications  vancomycin (VANCOCIN) IVPB 1000 mg/200 mL premix ( Intravenous Restarted 11/15/22 1923)  ceFEPIme (MAXIPIME) 2 g in sodium chloride 0.9 % 100 mL IVPB ( Intravenous Restarted 11/15/22 1839)  naproxen (NAPROSYN) tablet 500 mg (has no administration in time range)  traMADol (ULTRAM) tablet 50 mg (has no administration in time range)  ondansetron (ZOFRAN) injection 4 mg (has no administration in time range)  HYDROmorphone (DILAUDID) injection 0.5 mg (has no administration in time range)  sodium chloride 0.9 % bolus 1,000 mL (1,000 mLs Intravenous Bolus 11/15/22 1720)  morphine (PF) 4 MG/ML injection 4 mg (4 mg Intravenous Given 11/15/22 1720)  ondansetron (ZOFRAN) injection 4 mg (4 mg  Intravenous Given 11/15/22 1720)  ketorolac (TORADOL) 15 MG/ML injection 15 mg (15 mg Intravenous Given 11/15/22 1950)    ED Course/ Medical Decision Making/ A&P Clinical Course as of 11/15/22 2012  Fri Nov 15, 2022  1900 Dr. Cyndia Bent with hospitalist to evaluate for admission [BH]    Clinical Course User Index [BH] Nicolas Banh A, PA-C   52 year old history of HIV followed with [facility] here for evaluation of skin changes to right lower extremity.  Started Monday evening.  She denies any recent trauma or injury.  Has noticed aggressive erythema, edema and pain to right lower extremity.  No clinical evidence of DVT on exam.  She has skin changes including darkening of her distal toes 1 through 5.  Erythema, warmth to the dorsum of her right lower extremity from toes to mid anterior tibial region.  Her compartments are soft.  She has full range of motion without bony tenderness.  No chest pain, shortness of breath.  Will plan on labs, imaging and reassess.  On arrival patient tachycardic, possible source of infection, meet SIRS criteria  Labs and imaging personally viewed and interpreted:  CBC leukocytosis 11.9, hemoglobin 8.4 Metabolic panel potassium 3.2 Ultrasound negative for DVT X-ray right foot without soft tissue changes, no obvious osteomyelitis, fracture  Patient started on IV Abx. Given IVF, IV antibiotics, no hypotension, does not need 30/cc/kg bolus however does meet sepsis criteria given leukocytosis, tachycardia and source of infection.  She will need admission.  Discussed with patient, family in room.  She is agreeable for admission  The patient appears reasonably stabilized for admission considering the current resources, flow, and capabilities available in the ED at this time, and I doubt any other [facility] requiring further screening and/or treatment in the ED prior to admission.                                  Medical Decision Making Amount and/or Complexity of Data  Reviewed Independent Historian:  Details: Family in room External Data Reviewed: labs, radiology and notes. Labs: ordered. Decision-making details documented in ED Course. Radiology: ordered and independent interpretation performed. Decision-making details documented in ED Course.  Risk OTC drugs. Prescription drug management. Parenteral controlled substances. Decision regarding hospitalization. Diagnosis or treatment significantly limited by social determinants of health.          Final Clinical Impression(s) / ED Diagnoses Final diagnoses:  Sepsis without acute organ dysfunction, due to unspecified organism [facility])  Cellulitis of right lower extremity    Rx / DC Orders ED Discharge Orders     None         Sostenes Kauffmann A, PA-C 11/15/22 2012    Charlynne Pander, MD 11/15/22 2324

## 2022-11-15 NOTE — ED Notes (Addendum)
ED TO INPATIENT HANDOFF REPORT  ED Nurse Name and Phone #: Molli Hazard EMT-P was assisting busy RN [phone]  S Name/Age/Gender Blair Heys 52 y.o. female Room/Bed: OTFC/OTF  Code Status   Code Status: Full Code  Home/SNF/Other Home Patient oriented to: self, place, time, and situation, GCS 15  Is this baseline? Yes   Triage Complete: Triage complete  Chief Complaint Cellulitis [L03.90]  Triage Note Patient reports she got bit on her arm but now has discoloration and swelling with red streaking to right leg and foot.    Allergies Allergies  Allergen Reactions   Penicillins Itching    Level of Care/Admitting Diagnosis ED Disposition     ED Disposition  Admit   Condition  --   Comment  Hospital Area: [facility] [[id number]]  Level of Care: Med-Surg [16]  May admit patient to [facility] or [facility] if equivalent level of care is available:: No  Covid Evaluation: Asymptomatic - no recent exposure (last 10 days) testing not required  Diagnosis: Cellulitis [782956]  Admitting Physician: Anselm Jungling [[id number]]  Attending Physician: Anselm Jungling [[id number]]  Certification:: I certify this patient will need inpatient services for at least 2 midnights  Expected Medical Readiness: 11/19/2022          B Medical/Surgery History History reviewed. No pertinent past medical history. Past Surgical History:  Procedure Laterality Date   ABDOMINAL HYSTERECTOMY       A IV Location/Drains/Wounds Patient Lines/Drains/Airways Status     Active Line/Drains/Airways     Name Placement date Placement time Site Days   Peripheral IV 11/15/22 20 G 1" Anterior;Left Forearm 11/15/22  1719  Forearm  less than 1            Intake/Output Last 24 hours No intake or output data in the 24 hours ending 11/15/22 2001  Labs/Imaging Results for orders placed or performed during the hospital encounter of 11/15/22 (from the past 48 hour(s))  CBC with Differential      Status: Abnormal   Collection Time: 11/15/22  1:34 PM  Result Value Ref Range   WBC 11.9 (H) 4.0 - 10.5 K/uL   RBC 3.24 (L) 3.87 - 5.11 MIL/uL   Hemoglobin 8.4 (L) 12.0 - 15.0 g/dL   HCT 95.2 (L) 84.1 - 32.4 %   MCV 81.5 80.0 - 100.0 fL   MCH 25.9 (L) 26.0 - 34.0 pg   MCHC 31.8 30.0 - 36.0 g/dL   RDW 40.1 (H) 02.7 - 25.3 %   Platelets 268 150 - 400 K/uL   nRBC 0.0 0.0 - 0.2 %   Neutrophils Relative % 81 %   Neutro Abs 9.7 (H) 1.7 - 7.7 K/uL   Lymphocytes Relative 12 %   Lymphs Abs 1.4 0.7 - 4.0 K/uL   Monocytes Relative 5 %   Monocytes Absolute 0.6 0.1 - 1.0 K/uL   Eosinophils Relative 1 %   Eosinophils Absolute 0.1 0.0 - 0.5 K/uL   Basophils Relative 0 %   Basophils Absolute 0.0 0.0 - 0.1 K/uL   Immature Granulocytes 1 %   Abs Immature Granulocytes 0.09 (H) 0.00 - 0.07 K/uL    Comment: Performed at [facility], [address], [place], [place] [address]  Basic metabolic panel     Status: Abnormal   Collection Time: 11/15/22  1:34 PM  Result Value Ref Range   Sodium 138 135 - 145 mmol/L   Potassium 3.2 (L) 3.5 - 5.1 mmol/L  Chloride 107 98 - 111 mmol/L   CO2 23 22 - 32 mmol/L   Glucose, Bld 110 (H) 70 - 99 mg/dL    Comment: Glucose reference range applies only to samples taken after fasting for at least 8 hours.   BUN 9 6 - 20 mg/dL   Creatinine, Ser 4.78 0.44 - 1.00 mg/dL   Calcium 8.5 (L) 8.9 - 10.3 mg/dL   GFR, Estimated >29 >56 mL/min    Comment: (NOTE) Calculated using the CKD-EPI Creatinine Equation (2021)    Anion gap 8 5 - 15    Comment: Performed at [facility], [address], [place], [place] [address]   DG Foot Complete Right  Result Date: 11/15/2022 CLINICAL DATA:  Swelling EXAM: RIGHT FOOT COMPLETE - 3 VIEW COMPARISON:  None Available. FINDINGS: There is no evidence of fracture or dislocation. There is no evidence of arthropathy or other focal bone abnormality. Soft tissues are unremarkable. IMPRESSION: No acute osseous abnormality  Electronically Signed   By: Karen Kays M.D.   On: 11/15/2022 17:55   VAS [place] LOWER EXTREMITY VENOUS (DVT) (ONLY [facility])  Result Date: 11/15/2022  Lower Venous DVT Study Patient Name:  FIDELIA HROMADKA  Date of Exam:   11/15/2022 Medical Rec #: [id number]        Accession #:    [id number] Date of Birth: [birth year]        Patient Gender: F Patient Age:   17 years Exam Location:  [facility] Procedure:      VAS [place] LOWER EXTREMITY VENOUS (DVT) Referring Phys: KEN LE --------------------------------------------------------------------------------  Indications: Pain.  Risk Factors: Past pregnancy. Comparison Study: No prior study Performing Technologist: Shona Simpson  Examination Guidelines: A complete evaluation includes B-mode imaging, spectral Doppler, color Doppler, and power Doppler as needed of all accessible portions of each vessel. Bilateral testing is considered an integral part of a complete examination. Limited examinations for reoccurring indications may be performed as noted. The reflux portion of the exam is performed with the patient in reverse Trendelenburg.  +---------+---------------+---------+-----------+----------+--------------+ RIGHT    CompressibilityPhasicitySpontaneityPropertiesThrombus Aging +---------+---------------+---------+-----------+----------+--------------+ CFV      Full           Yes      Yes                                 +---------+---------------+---------+-----------+----------+--------------+ SFJ      Full                                                        +---------+---------------+---------+-----------+----------+--------------+ FV Prox  Full                                                        +---------+---------------+---------+-----------+----------+--------------+ FV Mid   Full                                                        +---------+---------------+---------+-----------+----------+--------------+  FV DistalFull                                                         +---------+---------------+---------+-----------+----------+--------------+ PFV      Full                                                        +---------+---------------+---------+-----------+----------+--------------+ POP      Full           Yes      Yes                                 +---------+---------------+---------+-----------+----------+--------------+ PTV      Full                                                        +---------+---------------+---------+-----------+----------+--------------+ PERO     Full                                                        +---------+---------------+---------+-----------+----------+--------------+   +----+---------------+---------+-----------+----------+--------------+ LEFTCompressibilityPhasicitySpontaneityPropertiesThrombus Aging +----+---------------+---------+-----------+----------+--------------+ CFV Full           Yes      Yes                                 +----+---------------+---------+-----------+----------+--------------+     Summary: RIGHT: - There is no evidence of deep vein thrombosis in the lower extremity.  - No cystic structure found in the popliteal fossa. - Ultrasound characteristics of enlarged lymph nodes are noted in the groin.  LEFT: - No evidence of common femoral vein obstruction.   *See table(s) above for measurements and observations.    Preliminary     Pending Labs Unresulted Labs (From admission, onward)     Start     Ordered   11/16/22 0500  CBC  Tomorrow morning,   R        11/15/22 1926   11/16/22 0500  Basic metabolic panel  Tomorrow morning,   R        11/15/22 1926   11/15/22 1627  Blood culture (routine x 2)  BLOOD CULTURE X 2,   R      11/15/22 1626            Vitals/Pain Today's Vitals   11/15/22 1331 11/15/22 1332 11/15/22 1845 11/15/22 1953  BP:   (!) 141/81   Pulse:   92   Resp:   18   Temp:   98.8 F  (37.1 C)   TempSrc:   Oral   SpO2:   100%   Weight:  145 lb (65.8 kg)    Height:  5\' 5"  (1.651 m)  PainSc: 10-Worst pain ever   5     Isolation Precautions No active isolations  Medications Medications  vancomycin (VANCOCIN) IVPB 1000 mg/200 mL premix ( Intravenous Restarted 11/15/22 1923)  ceFEPIme (MAXIPIME) 2 g in sodium chloride 0.9 % 100 mL IVPB ( Intravenous Restarted 11/15/22 1839)  naproxen (NAPROSYN) tablet 500 mg (has no administration in time range)  traMADol (ULTRAM) tablet 50 mg (has no administration in time range)  ondansetron (ZOFRAN) injection 4 mg (has no administration in time range)  HYDROmorphone (DILAUDID) injection 0.5 mg (has no administration in time range)  sodium chloride 0.9 % bolus 1,000 mL (1,000 mLs Intravenous Bolus 11/15/22 1720)  morphine (PF) 4 MG/ML injection 4 mg (4 mg Intravenous Given 11/15/22 1720)  ondansetron (ZOFRAN) injection 4 mg (4 mg Intravenous Given 11/15/22 1720)  ketorolac (TORADOL) 15 MG/ML injection 15 mg (15 mg Intravenous Given 11/15/22 1950)    Mobility walks     Focused Assessments Cardiac Assessment Handoff:    No results found for: "CKTOTAL", "CKMB", "CKMBINDEX", "TROPONINI" No results found for: "DDIMER" Does the Patient currently have chest pain? No   , Neuro Assessment Handoff:  Swallow screen pass? Yes          Neuro Assessment:   Neuro Checks:      Has TPA been given?  If patient is a Neuro Trauma and patient is going to OR before floor call report to 4N Charge nurse: [phone] or [phone]  , Renal Assessment Handoff:  Hemodialysis Schedule:  Last Hemodialysis date and time:   Restricted appendage:   , Pulmonary Assessment Handoff:  Lung sounds:         ,   R Recommendations: See Admitting Provider Note  Report given to:   Additional Notes:  Very pleasant. Did as much as we could in our busy ED. Cellulitis/redness/pain to right foot/leg. Present on fluids and Vaco via pump.

## 2022-11-15 NOTE — Assessment & Plan Note (Signed)
-  follows infectious disease with [facility] -last viral load in January was undetectable  -continue antiviral

## 2022-11-16 ENCOUNTER — Inpatient Hospital Stay (HOSPITAL_COMMUNITY): Payer: Managed Care, Other (non HMO)

## 2022-11-16 DIAGNOSIS — M79674 Pain in right toe(s): Secondary | ICD-10-CM | POA: Diagnosis not present

## 2022-11-16 DIAGNOSIS — L03115 Cellulitis of right lower limb: Secondary | ICD-10-CM | POA: Diagnosis not present

## 2022-11-16 DIAGNOSIS — B20 Human immunodeficiency virus [HIV] disease: Secondary | ICD-10-CM | POA: Diagnosis not present

## 2022-11-16 DIAGNOSIS — D509 Iron deficiency anemia, unspecified: Secondary | ICD-10-CM | POA: Diagnosis not present

## 2022-11-16 LAB — BASIC METABOLIC PANEL
Anion gap: 4 — ABNORMAL LOW (ref 5–15)
BUN: 10 mg/dL (ref 6–20)
CO2: 20 mmol/L — ABNORMAL LOW (ref 22–32)
Calcium: 7.8 mg/dL — ABNORMAL LOW (ref 8.9–10.3)
Chloride: 113 mmol/L — ABNORMAL HIGH (ref 98–111)
Creatinine, Ser: 0.7 mg/dL (ref 0.44–1.00)
GFR, Estimated: >60 mL/min (ref >60)
Glucose, Bld: 104 mg/dL — ABNORMAL HIGH (ref 70–99)
Potassium: 3.5 mmol/L (ref 3.5–5.1)
Sodium: 137 mmol/L (ref 135–145)

## 2022-11-16 LAB — IRON AND TIBC
Iron: 50 ug/dL (ref 28–170)
Saturation Ratios: 25 % (ref 10.4–31.8)
TIBC: 202 ug/dL — ABNORMAL LOW (ref 250–450)
UIBC: 152 ug/dL

## 2022-11-16 LAB — CBC
HCT: 22.8 % — ABNORMAL LOW (ref 36.0–46.0)
Hemoglobin: 7.1 g/dL — ABNORMAL LOW (ref 12.0–15.0)
MCH: 25.8 pg — ABNORMAL LOW (ref 26.0–34.0)
MCHC: 31.1 g/dL (ref 30.0–36.0)
MCV: 82.9 fL (ref 80.0–100.0)
Platelets: 226 10*3/uL (ref 150–400)
RBC: 2.75 MIL/uL — ABNORMAL LOW (ref 3.87–5.11)
RDW: 18.6 % — ABNORMAL HIGH (ref 11.5–15.5)
WBC: 8.8 10*3/uL (ref 4.0–10.5)
nRBC: 0 % (ref 0.0–0.2)

## 2022-11-16 LAB — FOLATE: Folate: 5 ng/mL — ABNORMAL LOW (ref >5.9)

## 2022-11-16 LAB — RETICULOCYTES
Immature Retic Fract: 11.1 % (ref 2.3–15.9)
RBC.: 3.05 MIL/uL — ABNORMAL LOW (ref 3.87–5.11)
Retic Count, Absolute: 136.9 10*3/uL (ref 19.0–186.0)
Retic Ct Pct: 4.5 % — ABNORMAL HIGH (ref 0.4–3.1)

## 2022-11-16 LAB — VAS US ABI WITH/WO TBI
Left ABI: 1.13
Right ABI: 1.15

## 2022-11-16 LAB — VITAMIN B12: Vitamin B-12: 194 pg/mL (ref 180–914)

## 2022-11-16 LAB — FERRITIN: Ferritin: 209 ng/mL (ref 11–307)

## 2022-11-16 LAB — ABO/RH: ABO/RH(D): A POS

## 2022-11-16 LAB — PREPARE RBC (CROSSMATCH)

## 2022-11-16 MED ORDER — SODIUM CHLORIDE 0.9% IV SOLUTION: Freq: Once | INTRAVENOUS | Status: AC

## 2022-11-16 NOTE — Progress Notes (Signed)
[facility]                                                                               [name], is a 52 y.o. female, DOB - October 30, 1970, [id number] Admit date - 11/15/2022    Outpatient Primary MD for the patient is System, Provider Not In  LOS - 1  days    Brief summary   Donna Thompson is a 52 y.o. female with medical history significant of HIV on antiviral, iron deficiency anemia who presents with discoloration of her right foot.      Assessment & Plan    Assessment and Plan: * Sepsis (HCC) -right foot cellulitis  -On exam, patient had notable blackened discoloration on the dorsal surface of right toes with a potential healed ulcer to the medial side of the right great toe.  However does not appear necrotic and no crepitus palpated.  ABI done and results reviewed with patient.  -will broaden antibiotics with IV vancomycin, cefepime and Flagyl -blood cultures pending , and negative .  Wbc count normalized. She remains afebrile.    Iron deficiency anemia Dropped hemoglobin from 9 to 7.1 .  Anemia panel will be ordered.  Transfuse to keep hemoglobin greater than 8.    HIV (human immunodeficiency virus infection) (HCC) -follows infectious disease with [facility] -last viral load in January was undetectable  -continue antivirals.   Abscess of right arm -indurated papular lesions on right anterior forearm. Will ultrasound to see if any drainable abscess. -on antibiotics as above   Hypokalemia  Replaced.      Estimated body mass index is 24.13 kg/m as calculated from the following:   Height as of this encounter: 5\' 5"  (1.651 m).   Weight as of this encounter: 65.8 kg.  Code Status: FULL CODE.  DVT Prophylaxis:  SCDs Start: 11/15/22 1926   Level of Care: Level of care: Med-Surg Family Communication: none at bedside.  Disposition Plan:    pending, plan for therapy eval.   Procedures:  ABI  Consultants:    none  Antimicrobials:   Anti-infectives (From admission, onward)    Start     Dose/Rate Route Frequency Ordered Stop   11/16/22 1000  efavirenz-emtricitabine-tenofovir (ATRIPLA) 600-200-300 MG per tablet 1 tablet       Note to Pharmacy: Take 1 tablet by mouth once daily.     1 tablet Oral Daily 11/15/22 2127     11/16/22 0800  vancomycin (VANCOREADY) IVPB 750 mg/150 mL        750 mg 150 mL/hr over 60 Minutes Intravenous Every 12 hours 11/15/22 2025     11/16/22 0300  ceFEPIme (MAXIPIME) 2 g in sodium chloride 0.9 % 100 mL IVPB        2 g 200 mL/hr over 30 Minutes Intravenous Every 8 hours 11/15/22 2112     11/15/22 2200  metroNIDAZOLE (FLAGYL) IVPB 500 mg        500 mg 100 mL/hr over 60 Minutes Intravenous Every 12 hours 11/15/22 2112     11/15/22 1700  vancomycin (VANCOCIN) IVPB 1000 mg/200 mL premix        1,000 mg 200 mL/hr  [facility]                                                                               [name], is a 52 y.o. female, DOB - October 30, 1970, [id number] Admit date - 11/15/2022    Outpatient Primary MD for the patient is System, Provider Not In  LOS - 1  days    Brief summary   Donna Thompson is a 52 y.o. female with medical history significant of HIV on antiviral, iron deficiency anemia who presents with discoloration of her right foot.      Assessment & Plan    Assessment and Plan: * Sepsis (HCC) -right foot cellulitis  -On exam, patient had notable blackened discoloration on the dorsal surface of right toes with a potential healed ulcer to the medial side of the right great toe.  However does not appear necrotic and no crepitus palpated.  ABI done and results reviewed with patient.  -will broaden antibiotics with IV vancomycin, cefepime and Flagyl -blood cultures pending , and negative .  Wbc count normalized. She remains afebrile.    Iron deficiency anemia Dropped hemoglobin from 9 to 7.1 .  Anemia panel will be ordered.  Transfuse to keep hemoglobin greater than 8.    HIV (human immunodeficiency virus infection) (HCC) -follows infectious disease with [facility] -last viral load in January was undetectable  -continue antivirals.   Abscess of right arm -indurated papular lesions on right anterior forearm. Will ultrasound to see if any drainable abscess. -on antibiotics as above   Hypokalemia  Replaced.      Estimated body mass index is 24.13 kg/m as calculated from the following:   Height as of this encounter: 5\' 5"  (1.651 m).   Weight as of this encounter: 65.8 kg.  Code Status: FULL CODE.  DVT Prophylaxis:  SCDs Start: 11/15/22 1926   Level of Care: Level of care: Med-Surg Family Communication: none at bedside.  Disposition Plan:    pending, plan for therapy eval.   Procedures:  ABI  Consultants:    none  Antimicrobials:   Anti-infectives (From admission, onward)    Start     Dose/Rate Route Frequency Ordered Stop   11/16/22 1000  efavirenz-emtricitabine-tenofovir (ATRIPLA) 600-200-300 MG per tablet 1 tablet       Note to Pharmacy: Take 1 tablet by mouth once daily.     1 tablet Oral Daily 11/15/22 2127     11/16/22 0800  vancomycin (VANCOREADY) IVPB 750 mg/150 mL        750 mg 150 mL/hr over 60 Minutes Intravenous Every 12 hours 11/15/22 2025     11/16/22 0300  ceFEPIme (MAXIPIME) 2 g in sodium chloride 0.9 % 100 mL IVPB        2 g 200 mL/hr over 30 Minutes Intravenous Every 8 hours 11/15/22 2112     11/15/22 2200  metroNIDAZOLE (FLAGYL) IVPB 500 mg        500 mg 100 mL/hr over 60 Minutes Intravenous Every 12 hours 11/15/22 2112     11/15/22 1700  vancomycin (VANCOCIN) IVPB 1000 mg/200 mL premix        1,000 mg 200 mL/hr  [facility]                                                                               [name], is a 52 y.o. female, DOB - October 30, 1970, [id number] Admit date - 11/15/2022    Outpatient Primary MD for the patient is System, Provider Not In  LOS - 1  days    Brief summary   Donna Thompson is a 52 y.o. female with medical history significant of HIV on antiviral, iron deficiency anemia who presents with discoloration of her right foot.      Assessment & Plan    Assessment and Plan: * Sepsis (HCC) -right foot cellulitis  -On exam, patient had notable blackened discoloration on the dorsal surface of right toes with a potential healed ulcer to the medial side of the right great toe.  However does not appear necrotic and no crepitus palpated.  ABI done and results reviewed with patient.  -will broaden antibiotics with IV vancomycin, cefepime and Flagyl -blood cultures pending , and negative .  Wbc count normalized. She remains afebrile.    Iron deficiency anemia Dropped hemoglobin from 9 to 7.1 .  Anemia panel will be ordered.  Transfuse to keep hemoglobin greater than 8.    HIV (human immunodeficiency virus infection) (HCC) -follows infectious disease with [facility] -last viral load in January was undetectable  -continue antivirals.   Abscess of right arm -indurated papular lesions on right anterior forearm. Will ultrasound to see if any drainable abscess. -on antibiotics as above   Hypokalemia  Replaced.      Estimated body mass index is 24.13 kg/m as calculated from the following:   Height as of this encounter: 5\' 5"  (1.651 m).   Weight as of this encounter: 65.8 kg.  Code Status: FULL CODE.  DVT Prophylaxis:  SCDs Start: 11/15/22 1926   Level of Care: Level of care: Med-Surg Family Communication: none at bedside.  Disposition Plan:    pending, plan for therapy eval.   Procedures:  ABI  Consultants:    none  Antimicrobials:   Anti-infectives (From admission, onward)    Start     Dose/Rate Route Frequency Ordered Stop   11/16/22 1000  efavirenz-emtricitabine-tenofovir (ATRIPLA) 600-200-300 MG per tablet 1 tablet       Note to Pharmacy: Take 1 tablet by mouth once daily.     1 tablet Oral Daily 11/15/22 2127     11/16/22 0800  vancomycin (VANCOREADY) IVPB 750 mg/150 mL        750 mg 150 mL/hr over 60 Minutes Intravenous Every 12 hours 11/15/22 2025     11/16/22 0300  ceFEPIme (MAXIPIME) 2 g in sodium chloride 0.9 % 100 mL IVPB        2 g 200 mL/hr over 30 Minutes Intravenous Every 8 hours 11/15/22 2112     11/15/22 2200  metroNIDAZOLE (FLAGYL) IVPB 500 mg        500 mg 100 mL/hr over 60 Minutes Intravenous Every 12 hours 11/15/22 2112     11/15/22 1700  vancomycin (VANCOCIN) IVPB 1000 mg/200 mL premix        1,000 mg 200 mL/hr  upper extremity at site of patient's clinical concern. Within the subcutaneous soft tissues at this location is an ovoid heterogeneously hypoechoic collection measuring 7 x 3 x 4 mm. Extensive surrounding edema and hyperemia within the subcutaneous fat. IMPRESSION: Subcentimeter hypoechoic collection within the subcutaneous soft tissues of the right forearm at site of patient's clinical concern with extensive surrounding edema and hyperemia. This may represent a small abscess or foreign body granuloma. Electronically Signed   By: Duanne Guess D.O.   On: 11/16/2022 11:57   VAS [place] ABI WITH/WO TBI  Result Date: 11/16/2022  LOWER EXTREMITY DOPPLER STUDY Patient Name:  Donna Thompson  Date of Exam:   11/16/2022 Medical Rec #: [id number]        Accession #:    [id number] Date of Birth: 1970-08-04        Patient Gender: F Patient Age:   56 years Exam Location:  [facility] Procedure:      VAS [place] ABI WITH/WO TBI Referring Phys: CHING TU --------------------------------------------------------------------------------  Indications: Ulceration of right great toe. Palaple and Dopplerable pulses per              chart notes Other Factors: Cellulitis and possible abscess of right arm, Sepsis, HIV.  Comparison Study: No prior study on file Performing Technologist: Sherren Kerns RVS  Examination Guidelines: A complete evaluation includes at minimum, Doppler waveform signals and systolic blood pressure reading at the level of bilateral brachial, anterior tibial, and posterior tibial arteries, when vessel segments are accessible. Bilateral testing is considered an integral part of a complete examination. Photoelectric Plethysmograph (PPG) waveforms and toe systolic pressure readings are included as required and additional duplex testing as needed. Limited examinations for reoccurring indications  may be performed as noted.  ABI Findings: +---------+------------------+-----+-----------+-------------------------------+ Right    Rt Pressure (mmHg)IndexWaveform   Comment                         +---------+------------------+-----+-----------+-------------------------------+ Brachial 151                    triphasic                                  +---------+------------------+-----+-----------+-------------------------------+ PTA      160               1.06 multiphasic                                +---------+------------------+-----+-----------+-------------------------------+ DP       173               1.15 multiphasic                                +---------+------------------+-----+-----------+-------------------------------+ Great Toe                       Normal     Unable to tolerate compression                                             on great toe                    +---------+------------------+-----+-----------+-------------------------------+ +---------+------------------+-----+-----------+-------+  upper extremity at site of patient's clinical concern. Within the subcutaneous soft tissues at this location is an ovoid heterogeneously hypoechoic collection measuring 7 x 3 x 4 mm. Extensive surrounding edema and hyperemia within the subcutaneous fat. IMPRESSION: Subcentimeter hypoechoic collection within the subcutaneous soft tissues of the right forearm at site of patient's clinical concern with extensive surrounding edema and hyperemia. This may represent a small abscess or foreign body granuloma. Electronically Signed   By: Duanne Guess D.O.   On: 11/16/2022 11:57   VAS [place] ABI WITH/WO TBI  Result Date: 11/16/2022  LOWER EXTREMITY DOPPLER STUDY Patient Name:  Donna Thompson  Date of Exam:   11/16/2022 Medical Rec #: [id number]        Accession #:    [id number] Date of Birth: 1970-08-04        Patient Gender: F Patient Age:   56 years Exam Location:  [facility] Procedure:      VAS [place] ABI WITH/WO TBI Referring Phys: CHING TU --------------------------------------------------------------------------------  Indications: Ulceration of right great toe. Palaple and Dopplerable pulses per              chart notes Other Factors: Cellulitis and possible abscess of right arm, Sepsis, HIV.  Comparison Study: No prior study on file Performing Technologist: Sherren Kerns RVS  Examination Guidelines: A complete evaluation includes at minimum, Doppler waveform signals and systolic blood pressure reading at the level of bilateral brachial, anterior tibial, and posterior tibial arteries, when vessel segments are accessible. Bilateral testing is considered an integral part of a complete examination. Photoelectric Plethysmograph (PPG) waveforms and toe systolic pressure readings are included as required and additional duplex testing as needed. Limited examinations for reoccurring indications  may be performed as noted.  ABI Findings: +---------+------------------+-----+-----------+-------------------------------+ Right    Rt Pressure (mmHg)IndexWaveform   Comment                         +---------+------------------+-----+-----------+-------------------------------+ Brachial 151                    triphasic                                  +---------+------------------+-----+-----------+-------------------------------+ PTA      160               1.06 multiphasic                                +---------+------------------+-----+-----------+-------------------------------+ DP       173               1.15 multiphasic                                +---------+------------------+-----+-----------+-------------------------------+ Great Toe                       Normal     Unable to tolerate compression                                             on great toe                    +---------+------------------+-----+-----------+-------------------------------+ +---------+------------------+-----+-----------+-------+

## 2022-11-16 NOTE — Plan of Care (Signed)
Problem: Activity: Goal: Risk for activity intolerance will decrease Outcome: Progressing

## 2022-11-16 NOTE — Progress Notes (Signed)
VASCULAR LAB    ABI has been performed.  See CV proc for preliminary results.   Reyanna Baley, RVT 11/16/2022, 9:14 AM

## 2022-11-17 DIAGNOSIS — B20 Human immunodeficiency virus [HIV] disease: Secondary | ICD-10-CM | POA: Diagnosis not present

## 2022-11-17 DIAGNOSIS — L03115 Cellulitis of right lower limb: Secondary | ICD-10-CM | POA: Diagnosis not present

## 2022-11-17 DIAGNOSIS — D509 Iron deficiency anemia, unspecified: Secondary | ICD-10-CM | POA: Diagnosis not present

## 2022-11-17 LAB — BASIC METABOLIC PANEL
Anion gap: 12 (ref 5–15)
BUN: 14 mg/dL (ref 6–20)
CO2: 20 mmol/L — ABNORMAL LOW (ref 22–32)
Calcium: 8.3 mg/dL — ABNORMAL LOW (ref 8.9–10.3)
Chloride: 106 mmol/L (ref 98–111)
Creatinine, Ser: 0.61 mg/dL (ref 0.44–1.00)
GFR, Estimated: >60 mL/min (ref >60)
Glucose, Bld: 70 mg/dL (ref 70–99)
Potassium: 3.7 mmol/L (ref 3.5–5.1)
Sodium: 138 mmol/L (ref 135–145)

## 2022-11-17 LAB — BPAM RBC
Blood Product Expiration Date: 202410162359
ISSUE DATE / TIME: 202409212034
Unit Type and Rh: 6200

## 2022-11-17 LAB — CBC WITH DIFFERENTIAL/PLATELET
Abs Immature Granulocytes: 0.05 10*3/uL (ref 0.00–0.07)
Abs Immature Granulocytes: 0.07 10*3/uL (ref 0.00–0.07)
Basophils Absolute: 0 10*3/uL (ref 0.0–0.1)
Basophils Absolute: 0 10*3/uL (ref 0.0–0.1)
Basophils Relative: 0 %
Basophils Relative: 0 %
Eosinophils Absolute: 0.1 10*3/uL (ref 0.0–0.5)
Eosinophils Absolute: 0.1 10*3/uL (ref 0.0–0.5)
Eosinophils Relative: 1 %
Eosinophils Relative: 1 %
HCT: 28.1 % — ABNORMAL LOW (ref 36.0–46.0)
HCT: 27.2 % — ABNORMAL LOW (ref 36.0–46.0)
Hemoglobin: 8.8 g/dL — ABNORMAL LOW (ref 12.0–15.0)
Hemoglobin: 8.8 g/dL — ABNORMAL LOW (ref 12.0–15.0)
Immature Granulocytes: 1 %
Immature Granulocytes: 1 %
Lymphocytes Relative: 22 %
Lymphocytes Relative: 15 %
Lymphs Abs: 2.2 10*3/uL (ref 0.7–4.0)
Lymphs Abs: 1.4 10*3/uL (ref 0.7–4.0)
MCH: 26.5 pg (ref 26.0–34.0)
MCH: 26.2 pg (ref 26.0–34.0)
MCHC: 31.3 g/dL (ref 30.0–36.0)
MCHC: 32.4 g/dL (ref 30.0–36.0)
MCV: 84.6 fL (ref 80.0–100.0)
MCV: 81 fL (ref 80.0–100.0)
Monocytes Absolute: 0.6 10*3/uL (ref 0.1–1.0)
Monocytes Absolute: 0.6 10*3/uL (ref 0.1–1.0)
Monocytes Relative: 6 %
Monocytes Relative: 6 %
Neutro Abs: 6.9 10*3/uL (ref 1.7–7.7)
Neutro Abs: 7.1 10*3/uL (ref 1.7–7.7)
Neutrophils Relative %: 70 %
Neutrophils Relative %: 77 %
Platelets: 234 10*3/uL (ref 150–400)
Platelets: 239 10*3/uL (ref 150–400)
RBC: 3.32 MIL/uL — ABNORMAL LOW (ref 3.87–5.11)
RBC: 3.36 MIL/uL — ABNORMAL LOW (ref 3.87–5.11)
RDW: 17.8 % — ABNORMAL HIGH (ref 11.5–15.5)
RDW: 17.8 % — ABNORMAL HIGH (ref 11.5–15.5)
WBC: 9.8 10*3/uL (ref 4.0–10.5)
WBC: 9.3 10*3/uL (ref 4.0–10.5)
nRBC: 0 % (ref 0.0–0.2)
nRBC: 0 % (ref 0.0–0.2)

## 2022-11-17 LAB — TYPE AND SCREEN
ABO/RH(D): A POS
Antibody Screen: NEGATIVE
Unit division: 0

## 2022-11-17 MED ORDER — FOLIC ACID 1 MG PO TABS
1.0000 mg | ORAL_TABLET | Freq: Every day | ORAL | Status: DC
  Administered 2022-11-17 – 2022-11-18 (×2): 1 mg via ORAL
  Filled 2022-11-17 (×2): qty 1

## 2022-11-17 MED ORDER — VITAMIN B-12 1000 MCG PO TABS
1000.0000 ug | ORAL_TABLET | Freq: Every day | ORAL | Status: DC
  Administered 2022-11-17 – 2022-11-18 (×2): 1000 ug via ORAL
  Filled 2022-11-17 (×2): qty 1

## 2022-11-17 NOTE — Plan of Care (Signed)

## 2022-11-17 NOTE — Progress Notes (Signed)
[facility]                                                                               Donna Thompson, is a 52 y.o. female, DOB - 1970-04-23, JOA:[id number] Admit date - 11/15/2022    Outpatient Primary MD for the patient is System, Provider Not In  LOS - 2  days    Brief summary   Donna Thompson is a 52 y.o. female with medical history significant of HIV on antiviral, iron deficiency anemia who presents with discoloration of her right foot.      Assessment & Plan    Assessment and Plan: * Sepsis (HCC) -right foot cellulitis  -On exam, patient had notable blackened discoloration on the dorsal surface of right toes with a potential healed ulcer to the medial side of the right great toe.  However does not appear necrotic and no crepitus palpated.  ABI done and results reviewed with patient.  -will broaden antibiotics with IV vancomycin, cefepime and Flagyl. Cellulitis improving, possible transition to oral antibiotics in am and discharge.  -blood cultures pending , and negative so far .  Wbc count normalized. She remains afebrile.    Iron deficiency anemia Dropped hemoglobin from 9 to 7.1 . S/p 1unit of prbc transfusion and hemoglobin improved to 8.8 and stable.  Anemia panel shows folate deficiency and vit b12 levels borderline.  Transfuse to keep hemoglobin greater than 8.    HIV (human immunodeficiency virus infection) (HCC) -follows infectious disease with [facility] -last viral load in January was undetectable  -continue antivirals.   Abscess of right arm -indurated papular lesions on right anterior forearm.  -on antibiotics as above - US shows Subcentimeter hypoechoic collection within the subcutaneous soft tissues of the right forearm at site of patient's clinical concern with extensive surrounding edema and hyperemia. This may represent a small abscess or foreign body granuloma. Improving.    Hypokalemia  Replaced.       Estimated body mass index is 24.13 kg/m as calculated from the following:   Height as of this encounter: 5\' 5"  (1.651 m).   Weight as of this encounter: 65.8 kg.  Code Status: FULL CODE.  DVT Prophylaxis:  SCDs Start: 11/15/22 1926   Level of Care: Level of care: Med-Surg Family Communication: none at bedside.  Disposition Plan:    discharge tomorrow.   Procedures:  ABI  Consultants:   none  Antimicrobials:   Anti-infectives (From admission, onward)    Start     Dose/Rate Route Frequency Ordered Stop   11/16/22 1000  efavirenz-emtricitabine-tenofovir (ATRIPLA) 600-200-300 MG per tablet 1 tablet       Note to Pharmacy: Take 1 tablet by mouth once daily.     1 tablet Oral Daily 11/15/22 2127     11/16/22 0800  vancomycin (VANCOREADY) IVPB 750 mg/150 mL        750 mg 150 mL/hr over 60 Minutes Intravenous Every 12 hours 11/15/22 2025     11/16/22 0300  ceFEPIme (MAXIPIME) 2 g in sodium chloride 0.9 % 100 mL IVPB        2 g 200 mL/hr over 30 Minutes Intravenous Every 8  hours 11/15/22 2112     11/15/22 2200  metroNIDAZOLE (FLAGYL) IVPB 500 mg        500 mg 100 mL/hr over 60 Minutes Intravenous Every 12 hours 11/15/22 2112     11/15/22 1700  vancomycin (VANCOCIN) IVPB 1000 mg/200 mL premix        1,000 mg 200 mL/hr over 60 Minutes Intravenous  Once 11/15/22 1657     11/15/22 1700  ceFEPIme (MAXIPIME) 2 g in sodium chloride 0.9 % 100 mL IVPB        2 g 200 mL/hr over 30 Minutes Intravenous  Once 11/15/22 1657 11/16/22 1406        Medications  Scheduled Meds:  vitamin B-12  1,000 mcg Oral Daily   efavirenz-emtricitabine-tenofovir  1 tablet Oral Daily   ferrous sulfate  325 mg Oral Q breakfast   folic acid  1 mg Oral Daily   montelukast  10 mg Oral Daily   Continuous Infusions:  ceFEPime (MAXIPIME) IV 2 g (11/17/22 1005)   metronidazole 500 mg (11/17/22 1044)   vancomycin 200 mL/hr at 11/16/22 8469   vancomycin 750 mg (11/17/22 1227)   PRN Meds:.acetaminophen,  HYDROmorphone (DILAUDID) injection, naproxen, ondansetron (ZOFRAN) IV, traMADol    Subjective:   Donna Thompson was seen and examined today.  Pain is much better,   Objective:   Vitals:   11/16/22 2359 11/17/22 0441 11/17/22 0725 11/17/22 1514  BP: 137/77 (!) 154/91 127/65 134/83  Pulse: 88 83 95 91  Resp: 16 18    Temp: 98.8 F (37.1 C) 98.4 F (36.9 C) 98.2 F (36.8 C) 98.5 F (36.9 C)  TempSrc: Oral Oral Oral Oral  SpO2: 100% 100% 99% 100%  Weight:      Height:        Intake/Output Summary (Last 24 hours) at 11/17/2022 1549 Last data filed at 11/17/2022 1005 Gross per 24 hour  Intake 626.25 ml  Output --  Net 626.25 ml   Filed Weights   11/15/22 1332  Weight: 65.8 kg     Exam General: Alert and oriented x 3, NAD Cardiovascular: S1 S2 auscultated, no murmurs, RRR Respiratory: air entry fair, no wheezing heard.  Gastrointestinal: Soft NT ND BS+ Ext: redness and swelling improving, tenderness over the right foot improving.  Neuro: alert and oriented.    Data Reviewed:  I have personally reviewed following labs and imaging studies   CBC Lab Results  Component Value Date   WBC 9.3 11/17/2022   RBC 3.36 (L) 11/17/2022   HGB 8.8 (L) 11/17/2022   HCT 27.2 (L) 11/17/2022   MCV 81.0 11/17/2022   MCH 26.2 11/17/2022   PLT 239 11/17/2022   MCHC 32.4 11/17/2022   RDW 17.8 (H) 11/17/2022   LYMPHSABS 1.4 11/17/2022   MONOABS 0.6 11/17/2022   EOSABS 0.1 11/17/2022   BASOSABS 0.0 11/17/2022     Last metabolic panel Lab Results  Component Value Date   NA 138 11/17/2022   K 3.7 11/17/2022   CL 106 11/17/2022   CO2 20 (L) 11/17/2022   BUN 14 11/17/2022   CREATININE 0.61 11/17/2022   GLUCOSE 70 11/17/2022   GFRNONAA >60 11/17/2022   CALCIUM 8.3 (L) 11/17/2022   ANIONGAP 12 11/17/2022    CBG (last 3)  No results for input(s): "GLUCAP" in the last 72 hours.    Coagulation Profile: No results for input(s): "INR", "PROTIME" in the last 168  hours.   Radiology Studies: [place] RT UPPER EXTREM LTD SOFT TISSUE NON  VASCULAR  Result Date: 11/16/2022 CLINICAL DATA:  Abscess.  Right forearm redness and swelling EXAM: ULTRASOUND RIGHT UPPER EXTREMITY LIMITED TECHNIQUE: Ultrasound examination of the upper extremity soft tissues was performed in the area of clinical concern. COMPARISON:  None Available. FINDINGS: Targeted ultrasound was performed of the soft tissues of the right upper extremity at site of patient's clinical concern. Within the subcutaneous soft tissues at this location is an ovoid heterogeneously hypoechoic collection measuring 7 x 3 x 4 mm. Extensive surrounding edema and hyperemia within the subcutaneous fat. IMPRESSION: Subcentimeter hypoechoic collection within the subcutaneous soft tissues of the right forearm at site of patient's clinical concern with extensive surrounding edema and hyperemia. This may represent a small abscess or foreign body granuloma. Electronically Signed   By: Duanne Guess D.O.   On: 11/16/2022 11:57   VAS [place] ABI WITH/WO TBI  Result Date: 11/16/2022  LOWER EXTREMITY DOPPLER STUDY Patient Name:  ETTEL SIPOS  Date of Exam:   11/16/2022 Medical Rec #: [id number]        Accession #:    [id number] Date of Birth: [birth year]        Patient Gender: F Patient Age:   59 years Exam Location:  [facility] Procedure:      VAS [place] ABI WITH/WO TBI Referring Phys: CHING TU --------------------------------------------------------------------------------  Indications: Ulceration of right great toe. Palaple and Dopplerable pulses per              chart notes Other Factors: Cellulitis and possible abscess of right arm, Sepsis, HIV.  Comparison Study: No prior study on file Performing Technologist: Sherren Kerns RVS  Examination Guidelines: A complete evaluation includes at minimum, Doppler waveform signals and systolic blood pressure reading at the level of bilateral brachial, anterior tibial, and posterior tibial  arteries, when vessel segments are accessible. Bilateral testing is considered an integral part of a complete examination. Photoelectric Plethysmograph (PPG) waveforms and toe systolic pressure readings are included as required and additional duplex testing as needed. Limited examinations for reoccurring indications may be performed as noted.  ABI Findings: +---------+------------------+-----+-----------+-------------------------------+ Right    Rt Pressure (mmHg)IndexWaveform   Comment                         +---------+------------------+-----+-----------+-------------------------------+ Brachial 151                    triphasic                                  +---------+------------------+-----+-----------+-------------------------------+ PTA      160               1.06 multiphasic                                +---------+------------------+-----+-----------+-------------------------------+ DP       173               1.15 multiphasic                                +---------+------------------+-----+-----------+-------------------------------+ Great Toe                       Normal     Unable to tolerate compression  on great toe                    +---------+------------------+-----+-----------+-------------------------------+ +---------+------------------+-----+-----------+-------+ Left     Lt Pressure (mmHg)IndexWaveform   Comment +---------+------------------+-----+-----------+-------+ Brachial 146                    triphasic          +---------+------------------+-----+-----------+-------+ PTA      161               1.07 multiphasic        +---------+------------------+-----+-----------+-------+ DP       171               1.13 multiphasic        +---------+------------------+-----+-----------+-------+ Great Toe151               1.00 Normal              +---------+------------------+-----+-----------+-------+ +-------+-----------+---------------+------------+------------+ ABI/TBIToday's ABIToday's TBI    Previous ABIPrevious TBI +-------+-----------+---------------+------------+------------+ Right  1.15       normal waveform                         +-------+-----------+---------------+------------+------------+ Left   1.13       1.00                                    +-------+-----------+---------------+------------+------------+  Summary: Right: Resting right ankle-brachial index is within normal range. Normal Great Toe waveform, unable to document pressure secondary to pain. Left: Resting left ankle-brachial index is within normal range. The left toe-brachial index is normal. *See table(s) above for measurements and observations.  Electronically signed by Coral Else MD on 11/16/2022 at 10:08:36 AM.    Final    DG Foot Complete Right  Result Date: 11/15/2022 CLINICAL DATA:  Swelling EXAM: RIGHT FOOT COMPLETE - 3 VIEW COMPARISON:  None Available. FINDINGS: There is no evidence of fracture or dislocation. There is no evidence of arthropathy or other focal bone abnormality. Soft tissues are unremarkable. IMPRESSION: No acute osseous abnormality Electronically Signed   By: Karen Kays M.D.   On: 11/15/2022 17:55       Kathlen Mody M.D. [facility] 11/17/2022, 3:49 PM  Available via Epic secure chat 7am-7pm After 7 pm, please refer to night coverage provider listed on amion.

## 2022-11-18 DIAGNOSIS — L03115 Cellulitis of right lower limb: Secondary | ICD-10-CM | POA: Diagnosis not present

## 2022-11-18 DIAGNOSIS — A419 Sepsis, unspecified organism: Secondary | ICD-10-CM | POA: Diagnosis not present

## 2022-11-18 DIAGNOSIS — B20 Human immunodeficiency virus [HIV] disease: Secondary | ICD-10-CM | POA: Diagnosis not present

## 2022-11-18 MED ORDER — FERROUS SULFATE 325 (65 FE) MG PO TABS: 325.0000 mg | ORAL_TABLET | Freq: Every day | ORAL | 3 refills | Status: AC

## 2022-11-18 MED ORDER — FOLIC ACID 1 MG PO TABS: 1.0000 mg | ORAL_TABLET | Freq: Every day | ORAL | 3 refills | Status: AC

## 2022-11-18 MED ORDER — CYANOCOBALAMIN 1000 MCG PO TABS: 1000.0000 ug | ORAL_TABLET | Freq: Every day | ORAL | 3 refills | Status: AC

## 2022-11-18 MED ORDER — DOXYCYCLINE HYCLATE 100 MG PO CAPS: 100.0000 mg | ORAL_CAPSULE | Freq: Two times a day (BID) | ORAL | 0 refills | Status: AC

## 2022-11-18 MED ORDER — CEPHALEXIN 500 MG PO CAPS: 500.0000 mg | ORAL_CAPSULE | Freq: Three times a day (TID) | ORAL | 0 refills | Status: AC

## 2022-11-18 NOTE — TOC Transition Note (Signed)
Transition of Care [facility]) - CM/SW Discharge Note   Patient Details  Name: Donna Thompson MRN: [id number] Date of Birth: Feb 27, 1970  [facility] CM/SW Contact:  Epifanio Lesches, RN Phone Number: 11/18/2022, 10:22 AM   Clinical Narrative:    Patient will DC to: Home Anticipated DC date: 11/18/2022 Family notified: yes Transport by: car      - right foot cellulitis  Per MD patient ready for DC today . RN, patient, and patient's family notified of DC. Pt without home health or DME needs. Pt to f/u with PCP/Tawanna Donzetta Kohut, NP  , noted on AVS. Pt without RX med concerns. Family to provide transportation to home.  RNCM will sign off for now as intervention is no longer needed. Please consult [place] again if new needs arise.    Final next level of care: Home/Self Care Barriers to Discharge: No Barriers Identified   Patient Goals and CMS Choice      Discharge Placement                         Discharge Plan and Services Additional resources added to the After Visit Summary for                                       Social Determinants of Health (SDOH) Interventions SDOH Screenings   Food Insecurity: No Food Insecurity (11/16/2022)  Housing: Low Risk  (11/16/2022)  Transportation Needs: No Transportation Needs (11/16/2022)  Utilities: Not At Risk (11/16/2022)  Tobacco Use: Low Risk  (11/15/2022)     Readmission Risk Interventions     No data to display

## 2022-11-18 NOTE — Plan of Care (Signed)
Patient educated on discharge paperwork. IV removed, no further questions.   Problem: Education: Goal: Knowledge of General Education information will improve Description: Including pain rating scale, medication(s)/side effects and non-pharmacologic comfort measures Outcome: Adequate for Discharge   Problem: Health Behavior/Discharge Planning: Goal: Ability to manage health-related needs will improve Outcome: Adequate for Discharge   Problem: Clinical Measurements: Goal: Ability to maintain clinical measurements within normal limits will improve Outcome: Adequate for Discharge Goal: Will remain free from infection Outcome: Adequate for Discharge Goal: Diagnostic test results will improve Outcome: Adequate for Discharge Goal: Respiratory complications will improve Outcome: Adequate for Discharge Goal: Cardiovascular complication will be avoided Outcome: Adequate for Discharge   Problem: Activity: Goal: Risk for activity intolerance will decrease Outcome: Adequate for Discharge   Problem: Nutrition: Goal: Adequate nutrition will be maintained Outcome: Adequate for Discharge   Problem: Coping: Goal: Level of anxiety will decrease Outcome: Adequate for Discharge   Problem: Elimination: Goal: Will not experience complications related to bowel motility Outcome: Adequate for Discharge Goal: Will not experience complications related to urinary retention Outcome: Adequate for Discharge   Problem: Pain Managment: Goal: General experience of comfort will improve Outcome: Adequate for Discharge   Problem: Safety: Goal: Ability to remain free from injury will improve Outcome: Adequate for Discharge   Problem: Skin Integrity: Goal: Risk for impaired skin integrity will decrease Outcome: Adequate for Discharge

## 2022-11-18 NOTE — Plan of Care (Signed)

## 2022-11-20 LAB — CULTURE, BLOOD (ROUTINE X 2)
Culture: NO GROWTH
Culture: NO GROWTH
Special Requests: ADEQUATE

## 2022-11-21 NOTE — Discharge Summary (Signed)
once daily.   ferrous sulfate 325 (65 FE) MG tablet Take 1 tablet (325 mg total) by mouth daily with breakfast.   folic acid 1 MG tablet Commonly known as: FOLVITE Take 1 tablet (1 mg total) by mouth daily.   montelukast 10 MG tablet Commonly known as: SINGULAIR Take 10 mg by mouth daily.        Follow-up Information     [name], NP/ [facility] Follow up.   Contact information: [address], [place], [place] [address] Phone: [phone]                Discharge Exam: Ceasar Mons Weights   11/15/22 1332  Weight: 65.8 kg   General exam: Appears calm and comfortable  Respiratory system: Clear to auscultation. Respiratory effort normal. Cardiovascular system: S1 & S2 heard, RRR. No JVD,  Gastrointestinal system: Abdomen is nondistended, soft and nontender.  Central nervous system: Alert and oriented. No focal neurological deficits. Extremities: upper extremity non tender , swelling has resolved. Right foot cellulitis almost resolved.  Skin: No rashes, lesions or ulcers Psychiatry: Mood & affect appropriate.    Condition at discharge: fair  The results of significant diagnostics from this hospitalization (including imaging, microbiology, ancillary and laboratory) are listed below for reference.   Imaging Studies: [place] RT UPPER EXTREM LTD SOFT TISSUE NON VASCULAR  Result Date: 11/16/2022 CLINICAL DATA:  Abscess.  Right forearm redness and swelling EXAM: ULTRASOUND RIGHT UPPER EXTREMITY LIMITED TECHNIQUE: Ultrasound examination of the upper extremity soft tissues was performed in the area of clinical concern. COMPARISON:  None Available. FINDINGS: Targeted ultrasound was performed of the soft tissues of the right upper extremity at site of patient's clinical concern. Within the subcutaneous soft tissues at this location is an ovoid heterogeneously hypoechoic collection measuring 7 x 3 x 4 mm. Extensive surrounding edema and hyperemia within the subcutaneous fat. IMPRESSION: Subcentimeter hypoechoic collection within the subcutaneous soft tissues of the right forearm at site of patient's clinical concern with extensive surrounding edema and hyperemia. This may represent a small abscess or foreign body granuloma. Electronically Signed   By: Duanne Guess D.O.   On: 11/16/2022 11:57   VAS [place] ABI WITH/WO TBI  Result Date: 11/16/2022  LOWER EXTREMITY DOPPLER STUDY Patient Name:  Donna Thompson  Date of Exam:   11/16/2022 Medical Rec #: [id number]         Accession #:    [id number] Date of Birth: [birth year]        Patient Gender: F Patient Age:   52 years Exam Location:  [facility] Procedure:      VAS [place] ABI WITH/WO TBI Referring Phys: CHING TU --------------------------------------------------------------------------------  Indications: Ulceration of right great toe. Palaple and Dopplerable pulses per              chart notes Other Factors: Cellulitis and possible abscess of right arm, Sepsis, HIV.  Comparison Study: No prior study on file Performing Technologist: Sherren Kerns RVS  Examination Guidelines: A complete evaluation includes at minimum, Doppler waveform signals and systolic blood pressure reading at the level of bilateral brachial, anterior tibial, and posterior tibial arteries, when vessel segments are accessible. Bilateral testing is considered an integral part of a complete examination. Photoelectric Plethysmograph (PPG) waveforms and toe systolic pressure readings are included as required and additional duplex testing as needed. Limited examinations for reoccurring indications may be performed as noted.  ABI Findings: +---------+------------------+-----+-----------+-------------------------------+ Right    Rt  Physician Discharge Summary   Patient: Donna Thompson MRN: [id number] DOB: 07-05-1970  Admit date:     11/15/2022  Discharge date: 11/18/2022  Discharge Physician: Kathlen Mody   PCP: System, Provider Not In   Recommendations at discharge:  Please follow up with ID as recommended.  Please follow up with PCP in one week.   Discharge Diagnoses: Principal Problem:   Sepsis (HCC) Active Problems:   Cellulitis   Abscess of right arm   HIV (human immunodeficiency virus infection) (HCC)   Iron deficiency anemia    Hospital Course: Donna Thompson is a 52 y.o. female with medical history significant of HIV on antiviral, iron deficiency anemia who presents with discoloration of her right foot.  Assessment and Plan:  * Sepsis (HCC) -right foot cellulitis  -On exam, patient had notable blackened discoloration on the dorsal surface of right toes with a potential healed ulcer to the medial side of the right great toe.  However does not appear necrotic and no crepitus palpated.  ABI done and results reviewed with patient.  -she was started on broad spectrum with IV vancomycin, cefepime and Flagyl. Cellulitis improving, transitioned to oral antibiotics in am and discharge.  -blood cultures pending , and negative so far .  Wbc count normalized. She remains afebrile.      Iron deficiency anemia Dropped hemoglobin from 9 to 7.1 . S/p 1unit of prbc transfusion and hemoglobin improved to 8.8 and stable.  Anemia panel shows folate deficiency and vit b12 levels borderline. Supplementation added.  Transfuse to keep hemoglobin greater than 8.      HIV (human immunodeficiency virus infection) (HCC) -follows infectious disease with [facility] -last viral load in January was undetectable  -continue antivirals.    Questionable Abscess of right arm -indurated papular lesions on right anterior forearm.  - on antibiotics.  - US shows Subcentimeter hypoechoic collection within the  subcutaneous soft tissues of the right forearm at site of patient's clinical concern with extensive surrounding edema and hyperemia. This may represent a small abscess or foreign body granuloma. With antibiotics, lesions on the forearm have decreased,. Probably a granuloma.        Hypokalemia  Replaced.        Estimated body mass index is 24.13 kg/m as calculated from the following:   Height as of this encounter: 5\' 5"  (1.651 m).   Weight as of this encounter: 65.8 kg.     Consultants: none.  Procedures performed: none.   Disposition: Home Diet recommendation:  Discharge Diet Orders (From admission, onward)     Start     Ordered   11/18/22 0000  Diet - low sodium heart healthy        11/18/22 0851           Regular diet DISCHARGE MEDICATION: Allergies as of 11/18/2022       Reactions   Penicillins Itching        Medication List     TAKE these medications    cephALEXin 500 MG capsule Commonly known as: KEFLEX Take 1 capsule (500 mg total) by mouth 3 (three) times daily for 10 days.   cyanocobalamin 1000 MCG tablet Take 1 tablet (1,000 mcg total) by mouth daily.   doxycycline 100 MG capsule Commonly known as: VIBRAMYCIN Take 1 capsule (100 mg total) by mouth 2 (two) times daily for 10 days.   efavirenz-emtricitabine-tenofovir 600-200-300 MG tablet Commonly known as: Atripla Take 1 tablet by mouth daily. Take 1 tablet by mouth  once daily.   ferrous sulfate 325 (65 FE) MG tablet Take 1 tablet (325 mg total) by mouth daily with breakfast.   folic acid 1 MG tablet Commonly known as: FOLVITE Take 1 tablet (1 mg total) by mouth daily.   montelukast 10 MG tablet Commonly known as: SINGULAIR Take 10 mg by mouth daily.        Follow-up Information     [name], NP/ [facility] Follow up.   Contact information: [address], [place], [place] [address] Phone: [phone]                Discharge Exam: Ceasar Mons Weights   11/15/22 1332  Weight: 65.8 kg   General exam: Appears calm and comfortable  Respiratory system: Clear to auscultation. Respiratory effort normal. Cardiovascular system: S1 & S2 heard, RRR. No JVD,  Gastrointestinal system: Abdomen is nondistended, soft and nontender.  Central nervous system: Alert and oriented. No focal neurological deficits. Extremities: upper extremity non tender , swelling has resolved. Right foot cellulitis almost resolved.  Skin: No rashes, lesions or ulcers Psychiatry: Mood & affect appropriate.    Condition at discharge: fair  The results of significant diagnostics from this hospitalization (including imaging, microbiology, ancillary and laboratory) are listed below for reference.   Imaging Studies: [place] RT UPPER EXTREM LTD SOFT TISSUE NON VASCULAR  Result Date: 11/16/2022 CLINICAL DATA:  Abscess.  Right forearm redness and swelling EXAM: ULTRASOUND RIGHT UPPER EXTREMITY LIMITED TECHNIQUE: Ultrasound examination of the upper extremity soft tissues was performed in the area of clinical concern. COMPARISON:  None Available. FINDINGS: Targeted ultrasound was performed of the soft tissues of the right upper extremity at site of patient's clinical concern. Within the subcutaneous soft tissues at this location is an ovoid heterogeneously hypoechoic collection measuring 7 x 3 x 4 mm. Extensive surrounding edema and hyperemia within the subcutaneous fat. IMPRESSION: Subcentimeter hypoechoic collection within the subcutaneous soft tissues of the right forearm at site of patient's clinical concern with extensive surrounding edema and hyperemia. This may represent a small abscess or foreign body granuloma. Electronically Signed   By: Duanne Guess D.O.   On: 11/16/2022 11:57   VAS [place] ABI WITH/WO TBI  Result Date: 11/16/2022  LOWER EXTREMITY DOPPLER STUDY Patient Name:  Donna Thompson  Date of Exam:   11/16/2022 Medical Rec #: [id number]         Accession #:    [id number] Date of Birth: [birth year]        Patient Gender: F Patient Age:   52 years Exam Location:  [facility] Procedure:      VAS [place] ABI WITH/WO TBI Referring Phys: CHING TU --------------------------------------------------------------------------------  Indications: Ulceration of right great toe. Palaple and Dopplerable pulses per              chart notes Other Factors: Cellulitis and possible abscess of right arm, Sepsis, HIV.  Comparison Study: No prior study on file Performing Technologist: Sherren Kerns RVS  Examination Guidelines: A complete evaluation includes at minimum, Doppler waveform signals and systolic blood pressure reading at the level of bilateral brachial, anterior tibial, and posterior tibial arteries, when vessel segments are accessible. Bilateral testing is considered an integral part of a complete examination. Photoelectric Plethysmograph (PPG) waveforms and toe systolic pressure readings are included as required and additional duplex testing as needed. Limited examinations for reoccurring indications may be performed as noted.  ABI Findings: +---------+------------------+-----+-----------+-------------------------------+ Right    Rt  once daily.   ferrous sulfate 325 (65 FE) MG tablet Take 1 tablet (325 mg total) by mouth daily with breakfast.   folic acid 1 MG tablet Commonly known as: FOLVITE Take 1 tablet (1 mg total) by mouth daily.   montelukast 10 MG tablet Commonly known as: SINGULAIR Take 10 mg by mouth daily.        Follow-up Information     [name], NP/ [facility] Follow up.   Contact information: [address], [place], [place] [address] Phone: [phone]                Discharge Exam: Ceasar Mons Weights   11/15/22 1332  Weight: 65.8 kg   General exam: Appears calm and comfortable  Respiratory system: Clear to auscultation. Respiratory effort normal. Cardiovascular system: S1 & S2 heard, RRR. No JVD,  Gastrointestinal system: Abdomen is nondistended, soft and nontender.  Central nervous system: Alert and oriented. No focal neurological deficits. Extremities: upper extremity non tender , swelling has resolved. Right foot cellulitis almost resolved.  Skin: No rashes, lesions or ulcers Psychiatry: Mood & affect appropriate.    Condition at discharge: fair  The results of significant diagnostics from this hospitalization (including imaging, microbiology, ancillary and laboratory) are listed below for reference.   Imaging Studies: [place] RT UPPER EXTREM LTD SOFT TISSUE NON VASCULAR  Result Date: 11/16/2022 CLINICAL DATA:  Abscess.  Right forearm redness and swelling EXAM: ULTRASOUND RIGHT UPPER EXTREMITY LIMITED TECHNIQUE: Ultrasound examination of the upper extremity soft tissues was performed in the area of clinical concern. COMPARISON:  None Available. FINDINGS: Targeted ultrasound was performed of the soft tissues of the right upper extremity at site of patient's clinical concern. Within the subcutaneous soft tissues at this location is an ovoid heterogeneously hypoechoic collection measuring 7 x 3 x 4 mm. Extensive surrounding edema and hyperemia within the subcutaneous fat. IMPRESSION: Subcentimeter hypoechoic collection within the subcutaneous soft tissues of the right forearm at site of patient's clinical concern with extensive surrounding edema and hyperemia. This may represent a small abscess or foreign body granuloma. Electronically Signed   By: Duanne Guess D.O.   On: 11/16/2022 11:57   VAS [place] ABI WITH/WO TBI  Result Date: 11/16/2022  LOWER EXTREMITY DOPPLER STUDY Patient Name:  Donna Thompson  Date of Exam:   11/16/2022 Medical Rec #: [id number]         Accession #:    [id number] Date of Birth: [birth year]        Patient Gender: F Patient Age:   52 years Exam Location:  [facility] Procedure:      VAS [place] ABI WITH/WO TBI Referring Phys: CHING TU --------------------------------------------------------------------------------  Indications: Ulceration of right great toe. Palaple and Dopplerable pulses per              chart notes Other Factors: Cellulitis and possible abscess of right arm, Sepsis, HIV.  Comparison Study: No prior study on file Performing Technologist: Sherren Kerns RVS  Examination Guidelines: A complete evaluation includes at minimum, Doppler waveform signals and systolic blood pressure reading at the level of bilateral brachial, anterior tibial, and posterior tibial arteries, when vessel segments are accessible. Bilateral testing is considered an integral part of a complete examination. Photoelectric Plethysmograph (PPG) waveforms and toe systolic pressure readings are included as required and additional duplex testing as needed. Limited examinations for reoccurring indications may be performed as noted.  ABI Findings: +---------+------------------+-----+-----------+-------------------------------+ Right    Rt  Physician Discharge Summary   Patient: Donna Thompson MRN: [id number] DOB: 07-05-1970  Admit date:     11/15/2022  Discharge date: 11/18/2022  Discharge Physician: Kathlen Mody   PCP: System, Provider Not In   Recommendations at discharge:  Please follow up with ID as recommended.  Please follow up with PCP in one week.   Discharge Diagnoses: Principal Problem:   Sepsis (HCC) Active Problems:   Cellulitis   Abscess of right arm   HIV (human immunodeficiency virus infection) (HCC)   Iron deficiency anemia    Hospital Course: Donna Thompson is a 52 y.o. female with medical history significant of HIV on antiviral, iron deficiency anemia who presents with discoloration of her right foot.  Assessment and Plan:  * Sepsis (HCC) -right foot cellulitis  -On exam, patient had notable blackened discoloration on the dorsal surface of right toes with a potential healed ulcer to the medial side of the right great toe.  However does not appear necrotic and no crepitus palpated.  ABI done and results reviewed with patient.  -she was started on broad spectrum with IV vancomycin, cefepime and Flagyl. Cellulitis improving, transitioned to oral antibiotics in am and discharge.  -blood cultures pending , and negative so far .  Wbc count normalized. She remains afebrile.      Iron deficiency anemia Dropped hemoglobin from 9 to 7.1 . S/p 1unit of prbc transfusion and hemoglobin improved to 8.8 and stable.  Anemia panel shows folate deficiency and vit b12 levels borderline. Supplementation added.  Transfuse to keep hemoglobin greater than 8.      HIV (human immunodeficiency virus infection) (HCC) -follows infectious disease with [facility] -last viral load in January was undetectable  -continue antivirals.    Questionable Abscess of right arm -indurated papular lesions on right anterior forearm.  - on antibiotics.  - US shows Subcentimeter hypoechoic collection within the  subcutaneous soft tissues of the right forearm at site of patient's clinical concern with extensive surrounding edema and hyperemia. This may represent a small abscess or foreign body granuloma. With antibiotics, lesions on the forearm have decreased,. Probably a granuloma.        Hypokalemia  Replaced.        Estimated body mass index is 24.13 kg/m as calculated from the following:   Height as of this encounter: 5\' 5"  (1.651 m).   Weight as of this encounter: 65.8 kg.     Consultants: none.  Procedures performed: none.   Disposition: Home Diet recommendation:  Discharge Diet Orders (From admission, onward)     Start     Ordered   11/18/22 0000  Diet - low sodium heart healthy        11/18/22 0851           Regular diet DISCHARGE MEDICATION: Allergies as of 11/18/2022       Reactions   Penicillins Itching        Medication List     TAKE these medications    cephALEXin 500 MG capsule Commonly known as: KEFLEX Take 1 capsule (500 mg total) by mouth 3 (three) times daily for 10 days.   cyanocobalamin 1000 MCG tablet Take 1 tablet (1,000 mcg total) by mouth daily.   doxycycline 100 MG capsule Commonly known as: VIBRAMYCIN Take 1 capsule (100 mg total) by mouth 2 (two) times daily for 10 days.   efavirenz-emtricitabine-tenofovir 600-200-300 MG tablet Commonly known as: Atripla Take 1 tablet by mouth daily. Take 1 tablet by mouth  once daily.   ferrous sulfate 325 (65 FE) MG tablet Take 1 tablet (325 mg total) by mouth daily with breakfast.   folic acid 1 MG tablet Commonly known as: FOLVITE Take 1 tablet (1 mg total) by mouth daily.   montelukast 10 MG tablet Commonly known as: SINGULAIR Take 10 mg by mouth daily.        Follow-up Information     [name], NP/ [facility] Follow up.   Contact information: [address], [place], [place] [address] Phone: [phone]                Discharge Exam: Ceasar Mons Weights   11/15/22 1332  Weight: 65.8 kg   General exam: Appears calm and comfortable  Respiratory system: Clear to auscultation. Respiratory effort normal. Cardiovascular system: S1 & S2 heard, RRR. No JVD,  Gastrointestinal system: Abdomen is nondistended, soft and nontender.  Central nervous system: Alert and oriented. No focal neurological deficits. Extremities: upper extremity non tender , swelling has resolved. Right foot cellulitis almost resolved.  Skin: No rashes, lesions or ulcers Psychiatry: Mood & affect appropriate.    Condition at discharge: fair  The results of significant diagnostics from this hospitalization (including imaging, microbiology, ancillary and laboratory) are listed below for reference.   Imaging Studies: [place] RT UPPER EXTREM LTD SOFT TISSUE NON VASCULAR  Result Date: 11/16/2022 CLINICAL DATA:  Abscess.  Right forearm redness and swelling EXAM: ULTRASOUND RIGHT UPPER EXTREMITY LIMITED TECHNIQUE: Ultrasound examination of the upper extremity soft tissues was performed in the area of clinical concern. COMPARISON:  None Available. FINDINGS: Targeted ultrasound was performed of the soft tissues of the right upper extremity at site of patient's clinical concern. Within the subcutaneous soft tissues at this location is an ovoid heterogeneously hypoechoic collection measuring 7 x 3 x 4 mm. Extensive surrounding edema and hyperemia within the subcutaneous fat. IMPRESSION: Subcentimeter hypoechoic collection within the subcutaneous soft tissues of the right forearm at site of patient's clinical concern with extensive surrounding edema and hyperemia. This may represent a small abscess or foreign body granuloma. Electronically Signed   By: Duanne Guess D.O.   On: 11/16/2022 11:57   VAS [place] ABI WITH/WO TBI  Result Date: 11/16/2022  LOWER EXTREMITY DOPPLER STUDY Patient Name:  Donna Thompson  Date of Exam:   11/16/2022 Medical Rec #: [id number]         Accession #:    [id number] Date of Birth: [birth year]        Patient Gender: F Patient Age:   52 years Exam Location:  [facility] Procedure:      VAS [place] ABI WITH/WO TBI Referring Phys: CHING TU --------------------------------------------------------------------------------  Indications: Ulceration of right great toe. Palaple and Dopplerable pulses per              chart notes Other Factors: Cellulitis and possible abscess of right arm, Sepsis, HIV.  Comparison Study: No prior study on file Performing Technologist: Sherren Kerns RVS  Examination Guidelines: A complete evaluation includes at minimum, Doppler waveform signals and systolic blood pressure reading at the level of bilateral brachial, anterior tibial, and posterior tibial arteries, when vessel segments are accessible. Bilateral testing is considered an integral part of a complete examination. Photoelectric Plethysmograph (PPG) waveforms and toe systolic pressure readings are included as required and additional duplex testing as needed. Limited examinations for reoccurring indications may be performed as noted.  ABI Findings: +---------+------------------+-----+-----------+-------------------------------+ Right    Rt

## 2022-12-11 ENCOUNTER — Ambulatory Visit: Payer: Managed Care, Other (non HMO) | Admitting: Student
